# Patient Record
Sex: Female | Born: 1980 | Race: White | Hispanic: No | Marital: Married | Smoking: Former smoker
Health system: Southern US, Community
[De-identification: ages and names within clinical notes are randomized; demographics above are authoritative.]

## PROBLEM LIST (undated history)

## (undated) ENCOUNTER — Inpatient Hospital Stay (HOSPITAL_COMMUNITY): Payer: Self-pay

## (undated) DIAGNOSIS — Z789 Other specified health status: Secondary | ICD-10-CM

## (undated) HISTORY — PX: FOOT SURGERY: SHX648

## (undated) HISTORY — PX: SKIN BIOPSY: SHX1

---

## 2009-08-24 ENCOUNTER — Encounter (INDEPENDENT_AMBULATORY_CARE_PROVIDER_SITE_OTHER): Payer: Self-pay | Admitting: Obstetrics and Gynecology

## 2009-08-24 ENCOUNTER — Inpatient Hospital Stay (HOSPITAL_COMMUNITY): Admission: AD | Admit: 2009-08-24 | Discharge: 2009-08-27 | Payer: Self-pay | Admitting: Obstetrics and Gynecology

## 2009-10-12 ENCOUNTER — Ambulatory Visit: Admission: RE | Admit: 2009-10-12 | Discharge: 2009-10-12 | Payer: Self-pay | Admitting: Obstetrics and Gynecology

## 2010-01-23 ENCOUNTER — Other Ambulatory Visit: Admission: RE | Admit: 2010-01-23 | Discharge: 2010-01-23 | Payer: Self-pay | Admitting: Obstetrics and Gynecology

## 2010-08-26 LAB — RPR: RPR Ser Ql: NONREACTIVE

## 2010-08-26 LAB — CBC
HCT: 40.6 % (ref 36.0–46.0)
Hemoglobin: 10.2 g/dL — ABNORMAL LOW (ref 12.0–15.0)
MCHC: 35 g/dL (ref 30.0–36.0)
MCV: 98.2 fL (ref 78.0–100.0)
Platelets: 210 10*3/uL (ref 150–400)
RBC: 2.96 MIL/uL — ABNORMAL LOW (ref 3.87–5.11)
RDW: 12.9 % (ref 11.5–15.5)
RDW: 12.9 % (ref 11.5–15.5)
WBC: 9.4 10*3/uL (ref 4.0–10.5)

## 2010-08-26 LAB — TYPE AND SCREEN: Antibody Screen: NEGATIVE

## 2011-02-12 DIAGNOSIS — Z01419 Encounter for gynecological examination (general) (routine) without abnormal findings: Secondary | ICD-10-CM | POA: Insufficient documentation

## 2011-02-14 ENCOUNTER — Other Ambulatory Visit (HOSPITAL_COMMUNITY)
Admission: RE | Admit: 2011-02-14 | Discharge: 2011-02-14 | Disposition: A | Discharge status: Home / Self Care | Payer: BC Managed Care – PPO | Source: Ambulatory Visit | Attending: Obstetrics and Gynecology | Admitting: Obstetrics and Gynecology

## 2012-03-10 LAB — OB RESULTS CONSOLE ABO/RH: RH Type: POSITIVE

## 2012-03-10 LAB — OB RESULTS CONSOLE HEPATITIS B SURFACE ANTIGEN: Hepatitis B Surface Ag: NEGATIVE

## 2012-06-01 ENCOUNTER — Encounter (HOSPITAL_COMMUNITY): Payer: Self-pay

## 2012-06-01 ENCOUNTER — Ambulatory Visit (HOSPITAL_COMMUNITY)
Admission: RE | Admit: 2012-06-01 | Discharge: 2012-06-01 | Disposition: A | Discharge status: Home / Self Care | Payer: 59 | Source: Ambulatory Visit | Attending: Obstetrics and Gynecology | Admitting: Obstetrics and Gynecology

## 2012-06-01 ENCOUNTER — Other Ambulatory Visit: Payer: Self-pay | Admitting: Obstetrics and Gynecology

## 2012-06-01 DIAGNOSIS — O3430 Maternal care for cervical incompetence, unspecified trimester: Secondary | ICD-10-CM

## 2012-06-01 DIAGNOSIS — O34219 Maternal care for unspecified type scar from previous cesarean delivery: Secondary | ICD-10-CM | POA: Insufficient documentation

## 2012-06-02 NOTE — L&D Delivery Note (Signed)
Delivery Note At 9:53 AM a viable female was delivered via Vaginal, Spontaneous Delivery (Presentation: ; Occiput Anterior).  Nuchal cord x 2 reduced  APGAR: 9, 9; weight 8 lb 6 oz (3799 g).   Placenta status: Intact, Spontaneous.  Cord: 3 vessels with the following complications: None.  Cord pH: NA  Anesthesia: None  Episiotomy: None Lacerations: 2nd degree Suture Repair: 3.0 vicryl Est. Blood Loss (mL): 300  Mom to postpartum.  Baby to nursery-stable.  Devin Foskey J. 09/30/2012, 12:56 PM

## 2012-07-21 ENCOUNTER — Encounter (HOSPITAL_COMMUNITY): Payer: Self-pay

## 2012-07-21 ENCOUNTER — Observation Stay (HOSPITAL_COMMUNITY)
Admission: AD | Admit: 2012-07-21 | Discharge: 2012-07-21 | Disposition: A | Discharge status: Home / Self Care | Payer: BC Managed Care – PPO | Source: Ambulatory Visit | Attending: Obstetrics and Gynecology | Admitting: Obstetrics and Gynecology

## 2012-07-21 ENCOUNTER — Observation Stay (HOSPITAL_COMMUNITY): Payer: BC Managed Care – PPO

## 2012-07-21 DIAGNOSIS — O4703 False labor before 37 completed weeks of gestation, third trimester: Secondary | ICD-10-CM

## 2012-07-21 DIAGNOSIS — O4700 False labor before 37 completed weeks of gestation, unspecified trimester: Principal | ICD-10-CM | POA: Insufficient documentation

## 2012-07-21 HISTORY — DX: Other specified health status: Z78.9

## 2012-07-21 LAB — WET PREP, GENITAL: Yeast Wet Prep HPF POC: NONE SEEN

## 2012-07-21 LAB — FETAL FIBRONECTIN: Fetal Fibronectin: NEGATIVE

## 2012-07-21 MED ORDER — ACETAMINOPHEN 325 MG PO TABS: 650.0000 mg | ORAL_TABLET | ORAL | Status: DC | PRN

## 2012-07-21 MED ORDER — NIFEDIPINE 10 MG PO CAPS: 10.0000 mg | ORAL_CAPSULE | Freq: Four times a day (QID) | ORAL | Status: DC

## 2012-07-21 MED ORDER — LACTATED RINGERS IV SOLN: INTRAVENOUS | Status: DC

## 2012-07-21 MED ORDER — CALCIUM CARBONATE ANTACID 500 MG PO CHEW: 2.0000 | CHEWABLE_TABLET | ORAL | Status: DC | PRN

## 2012-07-21 MED ORDER — NIFEDIPINE 10 MG PO CAPS
20.0000 mg | ORAL_CAPSULE | Freq: Once | ORAL | Status: AC
  Administered 2012-07-21: 20 mg via ORAL
  Filled 2012-07-21: qty 2

## 2012-07-21 MED ORDER — PRENATAL MULTIVITAMIN CH: 1.0000 | ORAL_TABLET | Freq: Every day | ORAL | Status: DC

## 2012-07-21 MED ORDER — LACTATED RINGERS IV SOLN
INTRAVENOUS | Status: DC
  Administered 2012-07-21 (×2): via INTRAVENOUS

## 2012-07-21 MED ORDER — ZOLPIDEM TARTRATE 5 MG PO TABS: 5.0000 mg | ORAL_TABLET | Freq: Every evening | ORAL | Status: DC | PRN

## 2012-07-21 MED ORDER — LACTATED RINGERS IV SOLN
Freq: Once | INTRAVENOUS | Status: AC
  Administered 2012-07-21: 13:00:00 via INTRAVENOUS

## 2012-07-21 MED ORDER — NIFEDIPINE 10 MG PO CAPS
10.0000 mg | ORAL_CAPSULE | Freq: Four times a day (QID) | ORAL | Status: DC
  Administered 2012-07-21: 10 mg via ORAL
  Filled 2012-07-21: qty 1

## 2012-07-21 MED ORDER — DOCUSATE SODIUM 100 MG PO CAPS: 100.0000 mg | ORAL_CAPSULE | Freq: Every day | ORAL | Status: DC

## 2012-07-21 NOTE — MAU Provider Note (Signed)
History     CSN: [id number]  Arrival date and time: 07/21/12 1141   First Provider Initiated Contact with Patient 07/21/12 1255      Chief Complaint  Patient presents with  . Labor Eval   HPI Samantha Farley 32 y.o. [redacted]w[redacted]d  Sent from the office today as she was having contractions in the office and had been monitored there.  Is here today for further monitoring.     OB History   Grav Para Term Preterm Abortions TAB SAB Ect Mult Living   2 1 1  0 0 0 0 0 0 1      Past Medical History  Diagnosis Date  . Medical history non-contributory     Past Surgical History  Procedure Laterality Date  . Cesarean section    . Foot surgery Bilateral     Some type of surgery with the bones when pt was 32yo-32yo    Family History  Problem Relation Age of Onset  . Hypertension Maternal Grandmother   . Heart disease Maternal Grandfather   . Hypertension Maternal Grandfather     History  Substance Use Topics  . Smoking status: Former Games developer  . Smokeless tobacco: Not on file  . Alcohol Use: No    Allergies:  Allergies  Allergen Reactions  . Amoxil (Amoxicillin) Rash    Prescriptions prior to admission  Medication Sig Dispense Refill  . famotidine (PEPCID) 20 MG tablet Take 20 mg by mouth 2 (two) times daily as needed for heartburn.      . Prenatal Vit-Fe Fumarate-FA (PRENATAL MULTIVITAMIN) TABS Take 1 tablet by mouth daily.        Review of Systems  Constitutional: Negative for fever.  Gastrointestinal: Negative for nausea and vomiting.       Noticing contractions  Genitourinary:       No vaginal discharge. No vaginal bleeding. No dysuria.  Neurological: Negative for headaches.   Physical Exam   Blood pressure 106/73, pulse 103, temperature 98.7 F (37.1 C), temperature source Oral, resp. rate 16, height 5\' 6"  (1.676 m), weight 80.377 kg (177 lb 3.2 oz), SpO2 99.00%.  Physical Exam  Nursing note and vitals reviewed. Constitutional: She is oriented to person,  place, and time. She appears well-developed and well-nourished.  HENT:  Head: Normocephalic.  Eyes: EOM are normal.  Neck: Neck supple.  GI: Soft. There is tenderness.  Uterine irritability noted on monitor - client reports not as strong as when she was in the office.  FHT reassuring.  Genitourinary:  Speculum exam done and FFN and wet prep done.  Cervix - closed, soft, thick  Musculoskeletal: Normal range of motion.  Neurological: She is alert and oriented to person, place, and time.  Skin: Skin is warm and dry.  Psychiatric: She has a normal mood and affect.    MAU Course  Procedures Results for orders placed during the hospital encounter of 07/21/12 (from the past 24 hour(s))  FETAL FIBRONECTIN     Status: None   Collection Time    07/21/12  1:00 PM      Result Value Range   Fetal Fibronectin NEGATIVE  NEGATIVE  WET PREP, GENITAL     Status: Abnormal   Collection Time    07/21/12  1:00 PM      Result Value Range   Yeast Wet Prep HPF POC NONE SEEN  NONE SEEN   Trich, Wet Prep NONE SEEN  NONE SEEN   Clue Cells Wet Prep HPF POC FEW (*)  NONE SEEN   WBC, Wet Prep HPF POC FEW (*) NONE SEEN   MDM 1310  Consult with Dr. Dion Body Client was given procardia 29 mg and then had episode of weakness and tachycardia to 130.  IV infusing and head of bed reclined.  Client feeling better after 30-45 minutes, but contractions began to become more obvious to client.  Uterine tracing was more irritability than regular labor contractions. Consult with Dr. Dion Body - to place in observation.  Assessment and Plan  Threatened preterm labor  Plan Moved to the floor for observation  Velmer Broadfoot 07/21/2012, 1:41 PM

## 2012-07-21 NOTE — Progress Notes (Signed)
Pt feeling light headed. HOB down. Lilyan Punt Np notified of v/s and pt complaint

## 2012-07-21 NOTE — Progress Notes (Signed)
Pt more aware of ctxs

## 2012-07-21 NOTE — Progress Notes (Signed)
To u/s via w/c and then to 157. Pt aware and agrees with plan of care

## 2012-07-21 NOTE — MAU Note (Signed)
Patient states she has been having contractions every 5-10 minutes since this am. Denies bleeding or leaking and reports good fetal movement.

## 2012-07-21 NOTE — Progress Notes (Signed)
Lilyan Punt NP at bedside. Pt feeling better. Cautioned to move slowly when changes position.

## 2012-07-21 NOTE — Progress Notes (Signed)
Report called to Marrion Coy RN in Antenatal. Pt will go to u/s first and then to 157

## 2012-07-21 NOTE — Progress Notes (Signed)
Lilyan Punt NP at bedside.

## 2012-07-21 NOTE — MAU Note (Signed)
PT to u/s first and then to 157 in antenatal

## 2012-07-23 NOTE — MAU Provider Note (Signed)
Reviewed

## 2012-09-23 ENCOUNTER — Encounter (HOSPITAL_COMMUNITY): Payer: Self-pay | Admitting: Pharmacist

## 2012-09-26 ENCOUNTER — Encounter (HOSPITAL_COMMUNITY): Payer: Self-pay | Admitting: *Deleted

## 2012-09-26 ENCOUNTER — Inpatient Hospital Stay (HOSPITAL_COMMUNITY)
Admission: AD | Admit: 2012-09-26 | Discharge: 2012-09-26 | Disposition: A | Discharge status: Home / Self Care | Payer: BC Managed Care – PPO | Source: Ambulatory Visit | Attending: Obstetrics and Gynecology | Admitting: Obstetrics and Gynecology

## 2012-09-26 DIAGNOSIS — O479 False labor, unspecified: Secondary | ICD-10-CM | POA: Insufficient documentation

## 2012-09-26 NOTE — MAU Note (Signed)
Pt presents with complaints of irregular contractions over the last couple of days that are now 5 mins apart since early this morning. Denies any bleeding or LOF.

## 2012-09-30 ENCOUNTER — Inpatient Hospital Stay (HOSPITAL_COMMUNITY)
Admission: AD | Admit: 2012-09-30 | Discharge: 2012-10-01 | DRG: 373 | Disposition: A | Discharge status: Home / Self Care | Payer: BC Managed Care – PPO | Source: Ambulatory Visit | Attending: Obstetrics and Gynecology | Admitting: Obstetrics and Gynecology

## 2012-09-30 ENCOUNTER — Encounter (HOSPITAL_COMMUNITY): Payer: Self-pay | Admitting: *Deleted

## 2012-09-30 DIAGNOSIS — O34219 Maternal care for unspecified type scar from previous cesarean delivery: Secondary | ICD-10-CM | POA: Diagnosis present

## 2012-09-30 LAB — CBC
Hemoglobin: 12.5 g/dL (ref 12.0–15.0)
MCH: 30.4 pg (ref 26.0–34.0)
MCHC: 34.2 g/dL (ref 30.0–36.0)
MCV: 88.8 fL (ref 78.0–100.0)
Platelets: 225 10*3/uL (ref 150–400)

## 2012-09-30 LAB — RPR: RPR Ser Ql: NONREACTIVE

## 2012-09-30 MED ORDER — LACTATED RINGERS IV SOLN: 500.0000 mL | INTRAVENOUS | Status: DC | PRN

## 2012-09-30 MED ORDER — DIPHENHYDRAMINE HCL 25 MG PO CAPS: 25.0000 mg | ORAL_CAPSULE | Freq: Four times a day (QID) | ORAL | Status: DC | PRN

## 2012-09-30 MED ORDER — LANOLIN HYDROUS EX OINT: TOPICAL_OINTMENT | CUTANEOUS | Status: DC | PRN

## 2012-09-30 MED ORDER — OXYCODONE-ACETAMINOPHEN 5-325 MG PO TABS
1.0000 | ORAL_TABLET | ORAL | Status: DC | PRN
Start: 2012-09-30 — End: 2012-10-01
  Administered 2012-09-30: 1 via ORAL
  Filled 2012-09-30: qty 1

## 2012-09-30 MED ORDER — FERROUS SULFATE 325 (65 FE) MG PO TABS
325.0000 mg | ORAL_TABLET | Freq: Two times a day (BID) | ORAL | Status: DC
  Administered 2012-09-30 – 2012-10-01 (×3): 325 mg via ORAL
  Filled 2012-09-30 (×3): qty 1

## 2012-09-30 MED ORDER — METHYLERGONOVINE MALEATE 0.2 MG/ML IJ SOLN: 0.2000 mg | INTRAMUSCULAR | Status: DC | PRN

## 2012-09-30 MED ORDER — ZOLPIDEM TARTRATE 5 MG PO TABS: 5.0000 mg | ORAL_TABLET | Freq: Every evening | ORAL | Status: DC | PRN

## 2012-09-30 MED ORDER — OXYCODONE-ACETAMINOPHEN 5-325 MG PO TABS: 1.0000 | ORAL_TABLET | ORAL | Status: DC | PRN

## 2012-09-30 MED ORDER — EPHEDRINE 5 MG/ML INJ
10.0000 mg | INTRAVENOUS | Status: DC | PRN
  Filled 2012-09-30: qty 2

## 2012-09-30 MED ORDER — ONDANSETRON HCL 4 MG/2ML IJ SOLN: 4.0000 mg | INTRAMUSCULAR | Status: DC | PRN

## 2012-09-30 MED ORDER — OXYTOCIN 40 UNITS IN LACTATED RINGERS INFUSION - SIMPLE MED
62.5000 mL/h | INTRAVENOUS | Status: DC
  Administered 2012-09-30: 500 mL/h via INTRAVENOUS
  Filled 2012-09-30: qty 1000

## 2012-09-30 MED ORDER — LACTATED RINGERS IV SOLN: 500.0000 mL | Freq: Once | INTRAVENOUS | Status: DC

## 2012-09-30 MED ORDER — TETANUS-DIPHTH-ACELL PERTUSSIS 5-2.5-18.5 LF-MCG/0.5 IM SUSP
0.5000 mL | Freq: Once | INTRAMUSCULAR | Status: AC
  Administered 2012-10-01: 0.5 mL via INTRAMUSCULAR
  Filled 2012-09-30: qty 0.5

## 2012-09-30 MED ORDER — CITRIC ACID-SODIUM CITRATE 334-500 MG/5ML PO SOLN: 30.0000 mL | ORAL | Status: DC | PRN

## 2012-09-30 MED ORDER — BENZOCAINE-MENTHOL 20-0.5 % EX AERO
1.0000 "application " | INHALATION_SPRAY | CUTANEOUS | Status: DC | PRN
  Administered 2012-09-30: 1 via TOPICAL
  Filled 2012-09-30: qty 56

## 2012-09-30 MED ORDER — WITCH HAZEL-GLYCERIN EX PADS: 1.0000 "application " | MEDICATED_PAD | CUTANEOUS | Status: DC | PRN

## 2012-09-30 MED ORDER — EPHEDRINE 5 MG/ML INJ
10.0000 mg | INTRAVENOUS | Status: DC | PRN
  Filled 2012-09-30: qty 2
  Filled 2012-09-30: qty 4

## 2012-09-30 MED ORDER — LACTATED RINGERS IV SOLN
INTRAVENOUS | Status: DC
  Administered 2012-09-30: 08:00:00 via INTRAVENOUS

## 2012-09-30 MED ORDER — SIMETHICONE 80 MG PO CHEW: 80.0000 mg | CHEWABLE_TABLET | ORAL | Status: DC | PRN

## 2012-09-30 MED ORDER — FENTANYL 2.5 MCG/ML BUPIVACAINE 1/10 % EPIDURAL INFUSION (WH - ANES)
14.0000 mL/h | INTRAMUSCULAR | Status: DC | PRN
  Filled 2012-09-30: qty 125

## 2012-09-30 MED ORDER — FLEET ENEMA 7-19 GM/118ML RE ENEM: 1.0000 | ENEMA | RECTAL | Status: DC | PRN

## 2012-09-30 MED ORDER — IBUPROFEN 600 MG PO TABS: 600.0000 mg | ORAL_TABLET | Freq: Four times a day (QID) | ORAL | Status: DC | PRN

## 2012-09-30 MED ORDER — ONDANSETRON HCL 4 MG PO TABS: 4.0000 mg | ORAL_TABLET | ORAL | Status: DC | PRN

## 2012-09-30 MED ORDER — SENNOSIDES-DOCUSATE SODIUM 8.6-50 MG PO TABS
2.0000 | ORAL_TABLET | Freq: Every day | ORAL | Status: DC
  Administered 2012-09-30: 2 via ORAL

## 2012-09-30 MED ORDER — IBUPROFEN 600 MG PO TABS
600.0000 mg | ORAL_TABLET | Freq: Four times a day (QID) | ORAL | Status: DC
  Administered 2012-09-30 – 2012-10-01 (×6): 600 mg via ORAL
  Filled 2012-09-30 (×6): qty 1

## 2012-09-30 MED ORDER — METHYLERGONOVINE MALEATE 0.2 MG PO TABS: 0.2000 mg | ORAL_TABLET | ORAL | Status: DC | PRN

## 2012-09-30 MED ORDER — ACETAMINOPHEN 325 MG PO TABS: 650.0000 mg | ORAL_TABLET | ORAL | Status: DC | PRN

## 2012-09-30 MED ORDER — BUTORPHANOL TARTRATE 1 MG/ML IJ SOLN
1.0000 mg | INTRAMUSCULAR | Status: DC | PRN
  Administered 2012-09-30: 1 mg via INTRAVENOUS
  Filled 2012-09-30: qty 1

## 2012-09-30 MED ORDER — DIBUCAINE 1 % RE OINT: 1.0000 "application " | TOPICAL_OINTMENT | RECTAL | Status: DC | PRN

## 2012-09-30 MED ORDER — DIPHENHYDRAMINE HCL 50 MG/ML IJ SOLN: 12.5000 mg | INTRAMUSCULAR | Status: DC | PRN

## 2012-09-30 MED ORDER — PHENYLEPHRINE 40 MCG/ML (10ML) SYRINGE FOR IV PUSH (FOR BLOOD PRESSURE SUPPORT)
80.0000 ug | PREFILLED_SYRINGE | INTRAVENOUS | Status: DC | PRN
  Filled 2012-09-30: qty 2

## 2012-09-30 MED ORDER — PRENATAL MULTIVITAMIN CH
1.0000 | ORAL_TABLET | Freq: Every day | ORAL | Status: DC
  Administered 2012-09-30 – 2012-10-01 (×2): 1 via ORAL
  Filled 2012-09-30 (×2): qty 1

## 2012-09-30 MED ORDER — PHENYLEPHRINE 40 MCG/ML (10ML) SYRINGE FOR IV PUSH (FOR BLOOD PRESSURE SUPPORT)
80.0000 ug | PREFILLED_SYRINGE | INTRAVENOUS | Status: DC | PRN
  Filled 2012-09-30: qty 5
  Filled 2012-09-30: qty 2

## 2012-09-30 MED ORDER — OXYTOCIN BOLUS FROM INFUSION: 500.0000 mL | INTRAVENOUS | Status: DC

## 2012-09-30 MED ORDER — LIDOCAINE HCL (PF) 1 % IJ SOLN
30.0000 mL | INTRAMUSCULAR | Status: DC | PRN
  Filled 2012-09-30 (×4): qty 30

## 2012-09-30 MED ORDER — ONDANSETRON HCL 4 MG/2ML IJ SOLN: 4.0000 mg | Freq: Four times a day (QID) | INTRAMUSCULAR | Status: DC | PRN

## 2012-09-30 NOTE — MAU Note (Addendum)
Patient states had gush of water at 0630 this am; [place];  Contractions very frequent.

## 2012-09-30 NOTE — H&P (Signed)
Samantha Farley is a 32 y.o. female presenting at 39 wks and 6 days complaining of regular contractions and LOF. She ws 5 cm in MAU progressed to 8 cm. Pregnancy is complicated by h/o cesarean section. She desires to [facility]     Maternal Medical History:  Reason for admission: Contractions.   Contractions: Onset was 3-5 hours ago.   Frequency: regular.   Duration is approximately 60 seconds.   Perceived severity is strong.    Fetal activity: Perceived fetal activity is normal.   Last perceived fetal movement was within the past hour.    Prenatal complications: no prenatal complications Prenatal Complications - Diabetes: none.    OB History   Grav Para Term Preterm Abortions TAB SAB Ect Mult Living   2 2 2  0 0 0 0 0 0 2     Past Medical History  Diagnosis Date  . Medical history non-contributory    Past Surgical History  Procedure Laterality Date  . Cesarean section    . Foot surgery Bilateral     Some type of surgery with the bones when pt was 32yo-32yo   Family History: family history includes Heart disease in her maternal grandfather and Hypertension in her maternal grandfather and maternal grandmother. Social History:  reports that she has quit smoking. She does not have any smokeless tobacco history on file. She reports that she does not drink alcohol or use illicit drugs.   Prenatal Transfer Tool  Maternal Diabetes: No Genetic Screening: Declined Maternal Ultrasounds/Referrals: Normal Fetal Ultrasounds or other Referrals:  None Maternal Substance Abuse:  No Significant Maternal Medications:  None Significant Maternal Lab Results:  Lab values include: Group B Strep negative Other Comments:  None  ROS  Dilation: 10 Effacement (%): 100 Station: +2 Exam by:: dherr rn Blood pressure 116/76, pulse 81, temperature 99 F (37.2 C), temperature source Oral, resp. rate 16, height 5\' 6"  (1.676 m), weight 88.451 kg (195 lb), SpO2 96.00%, unknown if currently  breastfeeding. Maternal Exam:  Uterine Assessment: Contraction strength is moderate.  Contraction duration is 60 seconds. Contraction frequency is regular.   Abdomen: Patient reports no abdominal tenderness. Fetal presentation: vertex  Introitus: Normal vulva. Normal vagina.  Amniotic fluid character: clear.  Pelvis: adequate for delivery.   Cervix: Cervix evaluated by digital exam.     Fetal Exam Fetal Monitor Review: Mode: fetoscope.   Baseline rate: 130.  Variability: moderate (6-25 bpm).   Pattern: accelerations present and no decelerations.    Fetal State Assessment: Category I - tracings are normal.     Physical Exam  Constitutional: She is oriented to person, place, and time. She appears well-developed and well-nourished.  Cardiovascular: Normal rate and regular rhythm.   Respiratory: Effort normal.  Genitourinary: Vagina normal.  Musculoskeletal: Normal range of motion.  Neurological: She is alert and oriented to person, place, and time.  Skin: Skin is warm and dry.    Prenatal labs: ABO, Rh: --/--/A POS (05/01 0915) Antibody: NEG (05/01 0915) Rubella: Immune (10/09 0000) RPR: Nonreactive (10/09 0000)  HBsAg: Negative (10/09 0000)  HIV: Non-reactive (10/09 0000)  GBS: Negative (10/09 0000)   Assessment/Plan: 39 wks and 6 days in active labor  Desires TOL pt has been counselled regarding r/o TOL including but not limited to uterine rupture with r/o fetal and/ or maternal mortality if rupture occurs.  She voiced understanding and desires trial of labor.   Kendrell Lottman J. 09/30/2012, 12:50 PM

## 2012-10-01 LAB — CBC
HCT: 29.2 % — ABNORMAL LOW (ref 36.0–46.0)
Hemoglobin: 9.8 g/dL — ABNORMAL LOW (ref 12.0–15.0)
RBC: 3.24 MIL/uL — ABNORMAL LOW (ref 3.87–5.11)

## 2012-10-01 MED ORDER — IBUPROFEN 600 MG PO TABS: 600.0000 mg | ORAL_TABLET | Freq: Four times a day (QID) | ORAL | Status: DC

## 2012-10-01 NOTE — Discharge Summary (Signed)
Obstetric Discharge Summary Reason for Admission: onset of labor and rupture of membranes Prenatal Procedures: ultrasound Intrapartum Procedures: VBAC Postpartum Procedures: none Complications-Operative and Postpartum: 2nd degree perineal laceration Hemoglobin  Date Value Range Status  10/01/2012 9.8* 12.0 - 15.0 g/dL Final     DELTA CHECK NOTED     REPEATED TO VERIFY     HCT  Date Value Range Status  10/01/2012 29.2* 36.0 - 46.0 % Final    Physical Exam:  General: alert, cooperative and no distress Lochia: appropriate Uterine Fundus: firm Incision: n/a DVT Evaluation: No evidence of DVT seen on physical exam. Calf/Ankle edema is present.  Discharge Diagnoses: Term Pregnancy-delivered  Discharge Information: Date: 10/01/2012 Activity: pelvic rest Diet: routine Medications: PNV and Ibuprofen Condition: stable Instructions: See discharge instructions. Discharge to: home Follow-up Information   Follow up with Geryl Rankins, MD. Schedule an appointment as soon as possible for a visit in 6 weeks. (Postpartum check)    Contact information:   [address], [address] [place] [address] [phone]       Newborn Data: Live born female  Birth Weight: 8 lb 7 oz (3827 g) APGAR: 9, 9  Home with mother. Circumcision done day of discharge.  Geryl Rankins 10/01/2012, 6:23 PM

## 2012-10-03 LAB — TYPE AND SCREEN
ABO/RH(D): A POS
Unit division: 0
Unit division: 0

## 2012-10-04 ENCOUNTER — Other Ambulatory Visit (HOSPITAL_COMMUNITY): Payer: Self-pay

## 2012-10-05 ENCOUNTER — Other Ambulatory Visit (HOSPITAL_COMMUNITY): Payer: Self-pay

## 2012-10-06 ENCOUNTER — Inpatient Hospital Stay (HOSPITAL_COMMUNITY)
Admission: RE | Admit: 2012-10-06 | Payer: BC Managed Care – PPO | Source: Ambulatory Visit | Admitting: Obstetrics and Gynecology

## 2012-10-06 ENCOUNTER — Encounter (HOSPITAL_COMMUNITY): Admission: RE | Payer: Self-pay | Source: Ambulatory Visit

## 2012-10-06 SURGERY — Surgical Case
Anesthesia: Regional

## 2012-11-11 ENCOUNTER — Other Ambulatory Visit: Payer: Self-pay | Admitting: Obstetrics and Gynecology

## 2012-11-11 ENCOUNTER — Other Ambulatory Visit (HOSPITAL_COMMUNITY)
Admission: RE | Admit: 2012-11-11 | Discharge: 2012-11-11 | Disposition: A | Discharge status: Home / Self Care | Payer: BC Managed Care – PPO | Source: Ambulatory Visit | Attending: Obstetrics and Gynecology | Admitting: Obstetrics and Gynecology

## 2012-11-11 DIAGNOSIS — Z01419 Encounter for gynecological examination (general) (routine) without abnormal findings: Secondary | ICD-10-CM | POA: Insufficient documentation

## 2012-11-11 DIAGNOSIS — Z1151 Encounter for screening for human papillomavirus (HPV): Secondary | ICD-10-CM | POA: Insufficient documentation

## 2013-02-23 ENCOUNTER — Ambulatory Visit (INDEPENDENT_AMBULATORY_CARE_PROVIDER_SITE_OTHER): Payer: BC Managed Care – PPO | Admitting: Neurology

## 2013-02-23 ENCOUNTER — Encounter: Payer: Self-pay | Admitting: Neurology

## 2013-02-23 VITALS — BP 120/82 | HR 76 | Ht 66.5 in | Wt 164.0 lb

## 2013-02-23 DIAGNOSIS — F09 Unspecified mental disorder due to known physiological condition: Secondary | ICD-10-CM

## 2013-02-23 DIAGNOSIS — R42 Dizziness and giddiness: Secondary | ICD-10-CM

## 2013-02-23 DIAGNOSIS — R4189 Other symptoms and signs involving cognitive functions and awareness: Secondary | ICD-10-CM

## 2013-02-23 NOTE — Progress Notes (Signed)
[facility]  Provider:  Dr Hosie Poisson Referring Provider: Hollice Espy, MD Primary Care Physician:  No primary provider on file.  CC:  Confusion, disorientation, vision changes  HPI:  Samantha Farley is a 32 y.o. female here as a referral from Dr. Kevan Ny for disorientation and vision changes  Around one month ago, she feels as been getting gradually worse since then. 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 She notes simple things like deciding whether or not to go on a green light when stopped it has become difficult for her. Remote memory remains intact. This is causing difficulty with work and daily functioning. She also feels her eyes have more trouble focusing, cannot block in entirety on an object. Feels unsteady on her feet, needs to take a step to catch self occasionally. Has not had any falls. Was feeling great prior to her on a month and a half ago. Had her second baby in May, felt great afterwards. Feels stress level is decreasing in her life, getting around 5 or 6 hours of sleep a night. No new medications in the past few months. No recent fevers, illnesses. No neck stiffness, no headaches. She does know getting bit by a dog a few weeks ago, states the dog had had a rabies vaccine.  Does get opthalmic migraines in the past, gets squiggly vision. Has one drink a night. Maternal grandfather has PD and dementia, maternal grandmother deceased from LBD. Paternal grandfather depressed and comitted suicide.   Review of Systems: Out of a complete 14 system review, the patient complains of only the following symptoms, and all other reviewed systems are negative. Positive for fatigue memory loss confusion dizziness increased thirst restless legs decreased energy allergies runny nose  History   Social History  . Marital Status: Married    Spouse Name: N/A    Number of Children: N/A  . Years of  Education: N/A   Occupational History  . Not on file.   Social History Main Topics  . Smoking status: Former Games developer  . Smokeless tobacco: Never Used  . Alcohol Use: No  . Drug Use: No  . Sexual Activity: Yes     Comment: pregnant   Other Topics Concern  . Not on file   Social History Narrative  . No narrative on file    Family History  Problem Relation Age of Onset  . Hypertension Maternal Grandmother   . Heart disease Maternal Grandfather   . Hypertension Maternal Grandfather     Past Medical History  Diagnosis Date  . Medical history non-contributory     Past Surgical History  Procedure Laterality Date  . Cesarean section    . Foot surgery Bilateral     Some type of surgery with the bones when pt was 32yo-32yo    Current Outpatient Prescriptions  Medication Sig Dispense Refill  . acetaminophen (TYLENOL) 500 MG tablet Take 1,000 mg by mouth every 6 (six) hours as needed for pain.      . famotidine (PEPCID) 20 MG tablet Take 20 mg by mouth 2 (two) times daily as needed for heartburn.      Marland Kitchen ibuprofen (ADVIL,MOTRIN) 600 MG tablet Take 1 tablet (600 mg total) by mouth every 6 (six) hours.  30 tablet  1  . Prenatal Vit-Fe Fumarate-FA (PRENATAL MULTIVITAMIN) TABS Take 1 tablet by mouth daily.       No current facility-administered medications for this visit.  Allergies as of 02/23/2013 - Review Complete 02/23/2013  Allergen Reaction Noted  . Amoxil [amoxicillin] Rash 07/21/2012    Vitals: BP 120/82  Pulse 76  Ht 5' 6.5" (1.689 m)  Wt 164 lb (74.39 kg)  BMI 26.08 kg/m2 Last Weight:  Wt Readings from Last 1 Encounters:  02/23/13 164 lb (74.39 kg)   Last Height:   Ht Readings from Last 1 Encounters:  02/23/13 5' 6.5" (1.689 m)     Physical exam: Exam: Gen: NAD, conversant Eyes: anicteric sclerae, moist conjunctivae HENT: Atraumatic Neck: Trachea midline; supple,  Lungs: CTA, no wheezing, rales, rhonic                          CV: RRR, no  MRG Abdomen: Soft, non-tender;  Extremities: No peripheral edema  Skin: Normal temperature, no rash,  Psych: Appropriate affect, pleasant  Neuro: Samantha: AA&Ox3, appropriately interactive, normal affect   MOCA 28/30 -1 fluency -1 Delayed recall  CN: L APD, EOMI no nystagmus, no ptosis, sensation intact to LT V1-V3 bilat, face symmetric, no weakness, hearing grossly intact, palate elevates symmetrically, shoulder shrug 5/5 bilat,  tongue protrudes midline, no fasiculations noted.  Motor: normal bulk and tone Strength: 5/5  In all extremities  Coord: rapid alternating and point-to-point (FNF, HTS) movements intact.  Reflexes: symmetrical, bilat downgoing toes  Sens: LT intact in all extremities  Gait: posture, stance, stride and arm-swing normal. Some difficulty but overall tandem gait intact. Able to walk on heels and toes. Romberg absent.   Assessment:  After physical and neurologic examination, review of laboratory studies, imaging, neurophysiology testing and pre-existing records, assessment will be reviewed on the problem list.  Plan:  Treatment plan and additional workup will be reviewed under Problem List.  Samantha Farley is a pleasant 32 year old woman who presents for initial evaluation of recent onset disorientation, depressed cognitive function, visual changes and gait changes. She currently works as a International aid/development worker and is a difficulty performing daily activities, trouble with processing information quickly and making decisions. This has begun to affect her job and daily life. Physical exam is pertinent for a mild left atrium pupil or defect and a MOCA of 28/30. Based on patient's education level this score should be considered abnormal. Unclear etiology of her symptoms. Based on history and exam findings, including left APD would have concern for multiple sclerosis. Will check lab work reversible causes of cognitive decline. Will check brain MRI with and without contrast. If  unremarkable would consider formal cognitive testing and or a lumbar puncture.  1)Cognitive decline 2)visual changes 3)dizziness  -MRI w/ and w/out contrast -lab work for reversible cause of cognitive decline -in future consider formal cognitive testing and/or lumbar puncture depending on above results -follow up once MRI complete

## 2013-02-23 NOTE — Patient Instructions (Signed)
Overall you are doing fairly well but I do want to suggest a few things today:   Remember to drink plenty of fluid, eat healthy meals and do not skip any meals. Try to eat protein with a every meal and eat a healthy snack such as fruit or nuts in between meals. Try to keep a regular sleep-wake schedule and try to exercise daily, particularly in the form of walking, 20-30 minutes a day, if you can.   As far as diagnostic testing:  1)MRI of the brain with and without contrast 2)Lab work to check for reversible causes of your memory decline  I would like to see you back once the MRI is complete, sooner if we need to. Please call us with any interim questions, concerns, problems, updates or refill requests.   Please also call us for any test results so we can go over those with you on the phone.  My clinical assistant and will answer any of your questions and relay your messages to me and also relay most of my messages to you.   Our phone number is [phone]. We also have an after hours call service for urgent matters and there is a physician on-call for urgent questions. For any emergencies you know to call 911 or go to the nearest emergency room

## 2013-02-28 ENCOUNTER — Encounter: Payer: Self-pay | Admitting: Neurology

## 2013-02-28 LAB — METHYLMALONIC ACID, SERUM: Methylmalonic Acid: 97 nmol/L (ref 0–378)

## 2013-02-28 LAB — TSH: TSH: 1.67 u[IU]/mL (ref 0.450–4.500)

## 2013-02-28 LAB — HOMOCYSTEINE: Homocysteine: 9.8 umol/L (ref 0.0–15.0)

## 2013-02-28 LAB — VITAMIN B1, WHOLE BLOOD: Thiamine: 164.6 nmol/L (ref 66.5–200.0)

## 2013-02-28 LAB — VITAMIN B12: Vitamin B-12: 325 pg/mL (ref 211–946)

## 2013-03-02 ENCOUNTER — Ambulatory Visit (INDEPENDENT_AMBULATORY_CARE_PROVIDER_SITE_OTHER): Payer: BC Managed Care – PPO

## 2013-03-02 DIAGNOSIS — R42 Dizziness and giddiness: Secondary | ICD-10-CM

## 2013-03-02 DIAGNOSIS — F09 Unspecified mental disorder due to known physiological condition: Secondary | ICD-10-CM

## 2013-03-02 DIAGNOSIS — R4189 Other symptoms and signs involving cognitive functions and awareness: Secondary | ICD-10-CM

## 2013-03-03 ENCOUNTER — Telehealth: Payer: Self-pay | Admitting: Neurology

## 2013-03-03 ENCOUNTER — Other Ambulatory Visit: Payer: Self-pay | Admitting: Neurology

## 2013-03-03 DIAGNOSIS — R42 Dizziness and giddiness: Secondary | ICD-10-CM

## 2013-03-03 DIAGNOSIS — R4189 Other symptoms and signs involving cognitive functions and awareness: Secondary | ICD-10-CM

## 2013-03-03 NOTE — Telephone Encounter (Signed)
Called patient to discuss results of MRI and lab work which were both normal. Will proceed with formal cognitive testing.

## 2013-04-20 ENCOUNTER — Telehealth: Payer: Self-pay | Admitting: Neurology

## 2013-04-20 NOTE — Telephone Encounter (Signed)
Please advise 

## 2013-04-21 NOTE — Telephone Encounter (Signed)
Called patient to inform her that Dr. Hosie Poisson stated that it was ok to cancel her appointment with the neuropsychiatrist. Patient verbalized understanding. I advised the patient that if she has any other problems, questions or concerns to call the office.

## 2013-04-21 NOTE — Telephone Encounter (Signed)
No she can cancel it. Thanks.

## 2013-05-11 DIAGNOSIS — R413 Other amnesia: Secondary | ICD-10-CM

## 2013-06-10 ENCOUNTER — Telehealth: Payer: Self-pay | Admitting: Neurology

## 2013-06-10 NOTE — Telephone Encounter (Signed)
REGARDING RECOMMENDATIONS SINCE SEEING DR. Leonides CaveZELSON

## 2013-06-14 ENCOUNTER — Telehealth: Payer: Self-pay | Admitting: Neurology

## 2013-06-14 DIAGNOSIS — R4189 Other symptoms and signs involving cognitive functions and awareness: Secondary | ICD-10-CM

## 2013-06-14 NOTE — Telephone Encounter (Signed)
Had results from [name] faxed and gave to [name] for review

## 2013-06-14 NOTE — Telephone Encounter (Signed)
Hi Stephanie,   Can you please contact Dr Darolyn RuaZelsons office and have them send a copy of the report over to us. Thanks.

## 2013-06-14 NOTE — Telephone Encounter (Signed)
Patient is requesting to speak with [name] concerning her visit with [name] and what are his recommendations about his diagnosis

## 2013-06-14 NOTE — Telephone Encounter (Signed)
Discussed results of formal cognitive testing with patient. Also discussed results with Dr. Leonides CaveZelson. [facility]

## 2013-06-24 ENCOUNTER — Telehealth: Payer: Self-pay | Admitting: Neurology

## 2013-06-24 NOTE — Telephone Encounter (Signed)
06-22-13 [facility] is [facility] and not taking any additional pts. Will try [facility] and [facility]. sp

## 2013-06-24 NOTE — Telephone Encounter (Signed)
Pt called and stated that Dr. Hosie PoissonSumner was supposed to refer her to another hospital.  He told her it would be at least a week to handle that referral.  She states it has been several weeks and she hasn't heard anything.  She would like to know the status of the referral.  Thank you.

## 2013-10-26 ENCOUNTER — Emergency Department (HOSPITAL_COMMUNITY): Payer: BC Managed Care – PPO

## 2013-10-26 ENCOUNTER — Encounter (HOSPITAL_COMMUNITY): Payer: Self-pay | Admitting: Emergency Medicine

## 2013-10-26 ENCOUNTER — Emergency Department (HOSPITAL_COMMUNITY)
Admission: EM | Admit: 2013-10-26 | Discharge: 2013-10-27 | Disposition: A | Discharge status: Home / Self Care | Payer: BC Managed Care – PPO | Attending: Emergency Medicine | Admitting: Emergency Medicine

## 2013-10-26 DIAGNOSIS — R51 Headache: Secondary | ICD-10-CM | POA: Insufficient documentation

## 2013-10-26 DIAGNOSIS — J029 Acute pharyngitis, unspecified: Secondary | ICD-10-CM | POA: Insufficient documentation

## 2013-10-26 DIAGNOSIS — Z87891 Personal history of nicotine dependence: Secondary | ICD-10-CM | POA: Insufficient documentation

## 2013-10-26 DIAGNOSIS — Z79899 Other long term (current) drug therapy: Secondary | ICD-10-CM | POA: Insufficient documentation

## 2013-10-26 DIAGNOSIS — M542 Cervicalgia: Secondary | ICD-10-CM | POA: Insufficient documentation

## 2013-10-26 DIAGNOSIS — M436 Torticollis: Secondary | ICD-10-CM | POA: Insufficient documentation

## 2013-10-26 DIAGNOSIS — Z880 Allergy status to penicillin: Secondary | ICD-10-CM | POA: Insufficient documentation

## 2013-10-26 DIAGNOSIS — R509 Fever, unspecified: Secondary | ICD-10-CM | POA: Insufficient documentation

## 2013-10-26 LAB — CBC WITH DIFFERENTIAL/PLATELET
Basophils Absolute: 0 10*3/uL (ref 0.0–0.1)
Basophils Relative: 0 % (ref 0–1)
EOS ABS: 0 10*3/uL (ref 0.0–0.7)
EOS PCT: 1 % (ref 0–5)
HCT: 40.1 % (ref 36.0–46.0)
HEMOGLOBIN: 13.6 g/dL (ref 12.0–15.0)
LYMPHS ABS: 1.6 10*3/uL (ref 0.7–4.0)
Lymphocytes Relative: 20 % (ref 12–46)
MCH: 30.1 pg (ref 26.0–34.0)
MCHC: 33.9 g/dL (ref 30.0–36.0)
MCV: 88.7 fL (ref 78.0–100.0)
MONOS PCT: 9 % (ref 3–12)
Monocytes Absolute: 0.7 10*3/uL (ref 0.1–1.0)
Neutro Abs: 5.6 10*3/uL (ref 1.7–7.7)
Neutrophils Relative %: 70 % (ref 43–77)
Platelets: 237 10*3/uL (ref 150–400)
RBC: 4.52 MIL/uL (ref 3.87–5.11)
RDW: 13.1 % (ref 11.5–15.5)
WBC: 8 10*3/uL (ref 4.0–10.5)

## 2013-10-26 LAB — BASIC METABOLIC PANEL
BUN: 11 mg/dL (ref 6–23)
CALCIUM: 8.8 mg/dL (ref 8.4–10.5)
CO2: 23 meq/L (ref 19–32)
Chloride: 99 mEq/L (ref 96–112)
Creatinine, Ser: 0.49 mg/dL — ABNORMAL LOW (ref 0.50–1.10)
GFR calc Af Amer: >90 mL/min (ref >90)
GLUCOSE: 80 mg/dL (ref 70–99)
POTASSIUM: 3.9 meq/L (ref 3.7–5.3)
Sodium: 139 mEq/L (ref 137–147)

## 2013-10-26 LAB — MONONUCLEOSIS SCREEN: Mono Screen: NEGATIVE

## 2013-10-26 LAB — RAPID STREP SCREEN (MED CTR MEBANE ONLY): Streptococcus, Group A Screen (Direct): NEGATIVE

## 2013-10-26 MED ORDER — KETOROLAC TROMETHAMINE 30 MG/ML IJ SOLN
15.0000 mg | Freq: Once | INTRAMUSCULAR | Status: AC
  Administered 2013-10-26: 15 mg via INTRAVENOUS
  Filled 2013-10-26: qty 1

## 2013-10-26 MED ORDER — DEXAMETHASONE SODIUM PHOSPHATE 10 MG/ML IJ SOLN
10.0000 mg | Freq: Once | INTRAMUSCULAR | Status: AC
  Administered 2013-10-26: 10 mg via INTRAVENOUS
  Filled 2013-10-26: qty 1

## 2013-10-26 MED ORDER — SODIUM CHLORIDE 0.9 % IV BOLUS (SEPSIS)
500.0000 mL | Freq: Once | INTRAVENOUS | Status: AC
  Administered 2013-10-26: 500 mL via INTRAVENOUS

## 2013-10-26 MED ORDER — ONDANSETRON HCL 4 MG/2ML IJ SOLN
4.0000 mg | Freq: Once | INTRAMUSCULAR | Status: AC
  Administered 2013-10-26: 4 mg via INTRAVENOUS
  Filled 2013-10-26: qty 2

## 2013-10-26 MED ORDER — SODIUM CHLORIDE 0.9 % IV SOLN
Freq: Once | INTRAVENOUS | Status: AC
  Administered 2013-10-26: 20:00:00 via INTRAVENOUS

## 2013-10-26 MED ORDER — MORPHINE SULFATE 4 MG/ML IJ SOLN
4.0000 mg | INTRAMUSCULAR | Status: DC | PRN
Start: 2013-10-26 — End: 2013-10-26

## 2013-10-26 MED ORDER — IOHEXOL 300 MG/ML  SOLN
100.0000 mL | Freq: Once | INTRAMUSCULAR | Status: AC | PRN
  Administered 2013-10-26: 80 mL via INTRAVENOUS

## 2013-10-26 MED ORDER — MORPHINE SULFATE 4 MG/ML IJ SOLN
4.0000 mg | Freq: Once | INTRAMUSCULAR | Status: AC
Start: 2013-10-26 — End: 2013-10-26
  Administered 2013-10-26: 4 mg via INTRAVENOUS
  Filled 2013-10-26: qty 1

## 2013-10-26 NOTE — ED Provider Notes (Signed)
CSN: [id number]     Arrival date & time 10/26/13  1718 History   First MD Initiated Contact with Patient 10/26/13 1850     Chief Complaint  Patient presents with  . Fever  . neck pain   . Headache     Patient is a 33 y.o. female presenting with fever and headaches. The history is provided by the patient.  Fever Associated symptoms: headaches   Associated symptoms: no chest pain, no chills, no confusion, no cough, no diarrhea, no dysuria, no nausea, no rash, no sore throat and no vomiting   Headache Associated symptoms: fever, neck pain and neck stiffness   Associated symptoms: no abdominal pain, no cough, no diarrhea, no dizziness, no fatigue, no nausea, no sore throat and no vomiting    Patient presents for evaluationof headache and neck pain.  On Sunday, 4 days ago she developed a stiff neck. Really no since awakening. Developed a posterior headache as later the same day. The examination developed a low-grade fever. Yesterday Tuesday morning it was 102. Headache and neck stiffness persistent. Seen in urgent care last night. No specific diagnosis made. Again had fever this morning. Some Motrin. Temperature has improved. Her headache has improved. She still feels some stiffness in her neck. This is mild, however persists. She's I have rash. She is photosensitivity. She has not had nausea or vomiting. She states she has a sore throat and she attributed to some sinus drainage. No ear pain although they feel full. No swelling in the neck. No difficulty with swallowing. No poor dentition. No joint pain   Past Medical History  Diagnosis Date  . Medical history non-contributory    Past Surgical History  Procedure Laterality Date  . Cesarean section    . Foot surgery Bilateral     Some type of surgery with the bones when pt was 33yo-33yo   Family History  Problem Relation Age of Onset  . Hypertension Maternal Grandmother   . Heart disease Maternal Grandfather   . Hypertension Maternal  Grandfather    History  Substance Use Topics  . Smoking status: Former Games developer  . Smokeless tobacco: Never Used  . Alcohol Use: No   OB History   Grav Para Term Preterm Abortions TAB SAB Ect Mult Living   2 2 2  0 0 0 0 0 0 2     Review of Systems  Constitutional: Positive for fever. Negative for chills, diaphoresis, appetite change and fatigue.  HENT: Negative for mouth sores, sore throat and trouble swallowing.   Eyes: Negative for visual disturbance.  Respiratory: Negative for cough, chest tightness, shortness of breath and wheezing.   Cardiovascular: Negative for chest pain.  Gastrointestinal: Negative for nausea, vomiting, abdominal pain, diarrhea and abdominal distention.  Endocrine: Negative for polydipsia, polyphagia and polyuria.  Genitourinary: Negative for dysuria, frequency and hematuria.  Musculoskeletal: Positive for neck pain and neck stiffness. Negative for gait problem.  Skin: Negative for color change, pallor and rash.  Neurological: Positive for headaches. Negative for dizziness, syncope and light-headedness.  Hematological: Does not bruise/bleed easily.  Psychiatric/Behavioral: Negative for behavioral problems and confusion.      Allergies  Amoxil  Home Medications   Prior to Admission medications   Medication Sig Start Date End Date Taking? Authorizing Provider  acetaminophen (TYLENOL) 500 MG tablet Take 1,000 mg by mouth every 6 (six) hours as needed for pain.   Yes Historical Provider, MD  cetirizine (ZYRTEC) 10 MG tablet Take 10 mg by mouth daily  as needed for allergies.   Yes Historical Provider, MD  ibuprofen (ADVIL,MOTRIN) 400 MG tablet Take 400 mg by mouth every 6 (six) hours as needed for headache or moderate pain.   Yes Historical Provider, MD  norgestimate-ethinyl estradiol (ORTHO-CYCLEN,SPRINTEC,PREVIFEM) 0.25-35 MG-MCG tablet Take 1 tablet by mouth daily.   Yes Historical Provider, MD   BP 119/78  Pulse 94  Temp(Src) 99.1 F (37.3 C) (Oral)   Resp 17  SpO2 100%  LMP 10/23/2013 Physical Exam  Constitutional: She is oriented to person, place, and time. She appears well-developed and well-nourished.  Non-toxic appearance. She does not have a sickly appearance. She does not appear ill. No distress.  She does not appear ill  HENT:  Head: Normocephalic.  Right Ear: Tympanic membrane is not injected.  Left Ear: Tympanic membrane is not injected.  Mouth/Throat:    Posterior pharyngeal tissues appear normal. Her tonsillar pillars are both slightly large 2+ cryptic. Exudate bilaterally. Normal tone. Normal uvula. For the mouth appears normal. She is minimally enlarged bilateral anterior cervical lymph nodes. No  posterior cervical lymph node  Eyes: Conjunctivae are normal. Pupils are equal, round, and reactive to light. No scleral icterus.  PERRL. Cranial nerves intact and symmetric.  Neck: Normal range of motion. Neck supple. No thyromegaly present.    Minimal limitation of neck movement due to pain. As she leans forward touch her chin to her chest she states she feels somewhat tight in the posterior aspect of her neck. No frank meningismus down the spine.  Cardiovascular: Normal rate and regular rhythm.  Exam reveals no gallop and no friction rub.   No murmur heard. Pulmonary/Chest: Effort normal and breath sounds normal. No respiratory distress. She has no wheezes. She has no rales.  Clear lungs  Abdominal: Soft. Bowel sounds are normal. She exhibits no distension. There is no tenderness. There is no rebound.  Soft benign abdomen  Musculoskeletal: Normal range of motion.  Neurological: She is alert and oriented to person, place, and time.  Skin: Skin is warm and dry. No rash noted.  No skin rash  Psychiatric: She has a normal mood and affect. Her behavior is normal.    ED Course  LUMBAR PUNCTURE Date/Time: 10/26/2013 11:35 PM Performed by: Rolland PorterJAMES, Kyri Dai Authorized by: Rolland PorterJAMES, Dima Mini Consent: Verbal consent obtained. written  consent obtained. Risks and benefits: risks, benefits and alternatives were discussed Consent given by: patient Patient understanding: patient states understanding of the procedure being performed Patient identity confirmed: verbally with patient Time out: Immediately prior to procedure a "time out" was called to verify the correct patient, procedure, equipment, support staff and site/side marked as required. Indications: evaluation for infection Anesthesia: local infiltration Local anesthetic: lidocaine 1% without epinephrine Anesthetic total: 3 ml Patient sedated: no Preparation: Patient was prepped and draped in the usual sterile fashion. Lumbar space: L3-L4 interspace Patient's position: left lateral decubitus Needle gauge: 22 Needle type: diamond point Needle length: 3.5 in Number of attempts: 1 Opening pressure: 8 cm H2O Fluid appearance: clear Tubes of fluid: 4 Total volume: 4 ml Post-procedure: adhesive bandage applied Patient tolerance: Patient tolerated the procedure well with no immediate complications.   (including critical care time) Labs Review Labs Reviewed  BASIC METABOLIC PANEL - Abnormal; Notable for the following:    Creatinine, Ser 0.49 (*)    All other components within normal limits  RAPID STREP SCREEN  GRAM STAIN  CULTURE, GROUP A STREP  CSF CULTURE  CBC WITH DIFFERENTIAL  MONONUCLEOSIS SCREEN  GLUCOSE, CSF  PROTEIN, CSF  CSF CELL COUNT WITH DIFFERENTIAL  CSF CELL COUNT WITH DIFFERENTIAL    Imaging Review Ct Soft Tissue Neck W Contrast  10/26/2013   CLINICAL DATA:  Neck pain and stiffness, fever, throat pain  EXAM: CT NECK WITH CONTRAST  TECHNIQUE: Multidetector CT imaging of the neck was performed using the standard protocol following the bolus administration of intravenous contrast. Sagittal and coronal MPR images reconstructed from axial data set.  CONTRAST:  80mL OMNIPAQUE IOHEXOL 300 MG/ML  SOLN IV  COMPARISON:  None  FINDINGS: Visualized  intracranial structures grossly normal appearance for nondedicated technique.  Beam hardening artifacts of dental origin.  Vascular structures patent.  Prominent parapharyngeal and tonsillar lymphoid tissue.  No evidence of parapharyngeal abscess or soft tissue edema.  Scattered normal size to enlarged cervical lymph nodes bilaterally.  Symmetric parotid, submandibular and thyroid glands.  Prevertebral soft tissues normal thickness.  Lung apices clear.  No pharyngeal, hypo pharyngeal, or airway narrowing.  No regional mass or abnormal fluid collection.  Osseous structures unremarkable.  IMPRESSION: Scattered normal size to mildly enlarged cervical lymph nodes bilaterally, nonspecific.  Otherwise negative CT neck.   Electronically Signed   By: Ulyses Southward M.D.   On: 10/26/2013 22:52     EKG Interpretation None      MDM   Final diagnoses:  Fever  Neck pain    Initial impression was for possible meningitis although her exam is not markedly suggested. She's been symptomatic for 3 days appears quite nontoxic. We discussed CT of her neck. She has some bilateral pain or discomfort just inferior to the ramus of the mandible. It does not appear asymmetric. Strep swab is negative. CT neck is negative with exception of a large lymphatic tissue and minimally enlarged even normal limits nodes. No abscess. No retropharyngeal abscess.  We discussed lumbar puncture. At this 0.72 hours if she doesn't meningitis will clearly be a septic. We discussed plus minus of the lumbar puncture. She consents to have this performed. Tolerated this without difficulty. Clear initially. She tolerated this well. Given some IV Decadron.  Spinal fluid shows one white blood cell, no red blood cells. Negative Gram stain.    Rolland Porter, MD 10/27/13 [phone]

## 2013-10-26 NOTE — ED Notes (Signed)
Patient transported to CT 

## 2013-10-26 NOTE — Progress Notes (Signed)
  CARE MANAGEMENT ED NOTE 10/26/2013  Patient:  Samantha Farley, Samantha Farley   Account Number:  [id number]  Date Initiated:  10/26/2013  Documentation initiated by:  Radford Pax  Subjective/Objective Assessment:   Patient presents to Ed with pain and stiffness, headache and fever.     Subjective/Objective Assessment Detail:     Action/Plan:   Action/Plan Detail:   Anticipated DC Date:       Status Recommendation to Physician:   Result of Recommendation:    Other ED Services  Consult Working Plan    DC Planning Services  Other  PCP issues    Choice offered to / List presented to:            Status of service:  Completed, signed off  ED Comments:   ED Comments Detail:  EDCM spoke to patient at bedside.  Patient confirms she does not have a pcp.  EDCM instructed patient to call the phone number on the back of her insurance card or go to insurance company website to assist patient to find a pcp who is close to her and within network.  Patient verbalized understanding.  No further EDCM needs at this time.

## 2013-10-26 NOTE — ED Notes (Signed)
Pt c/o neck stiffness/pain that she woke up with on Sunday. Later Sunday she started having a headache.  Pt felt bad at [facility] and went to urgent care yesterday. Had fever 102.5 fever last night and 102.7 this morning. Pt took ibuprofen and fever returned to normal. WBC was normal at Urgent care. Pt didn't feel she needed to go to ED last night and now wanting to make sure she doesn't have meningitis.

## 2013-10-27 LAB — GRAM STAIN
Gram Stain: NONE SEEN
Special Requests: NORMAL

## 2013-10-27 LAB — CSF CELL COUNT WITH DIFFERENTIAL
RBC Count, CSF: 0 /mm3
RBC Count, CSF: 0 /mm3
Tube #: 1
Tube #: 4
WBC CSF: 0 /mm3 (ref 0–5)
WBC, CSF: 1 /mm3 (ref 0–5)

## 2013-10-27 LAB — PROTEIN, CSF: Total  Protein, CSF: 16 mg/dL (ref 15–45)

## 2013-10-27 LAB — GLUCOSE, CSF: Glucose, CSF: 56 mg/dL (ref 43–76)

## 2013-10-27 MED ORDER — HYDROCODONE-ACETAMINOPHEN 5-325 MG PO TABS: 2.0000 | ORAL_TABLET | ORAL | Status: DC | PRN

## 2013-10-27 MED ORDER — PREDNISONE 10 MG PO TABS: 20.0000 mg | ORAL_TABLET | Freq: Every day | ORAL | Status: DC

## 2013-10-27 NOTE — Discharge Instructions (Signed)
Your spinal fluid testing is not show any signs of meningitis. Your strep test was negative. Rest, Tylenol or Motrin, Vicodin for pain. We will contact you if your strep test for spinal fluid culture turns positive. Prednisone to help with the pain and stiffness in your neck.  Fever, Adult A fever is a higher than normal body temperature. In an adult, an oral temperature around 98.6 F (37 C) is considered normal. A temperature of 100.4 F (38 C) or higher is generally considered a fever. Mild or moderate fevers generally have no long-term effects and often do not require treatment. Extreme fever (greater than or equal to 106 F or 41.1 C) can cause seizures. The sweating that may occur with repeated or prolonged fever may cause dehydration. Elderly people can develop confusion during a fever. A measured temperature can vary with:  Age.  Time of day.  Method of measurement (mouth, underarm, rectal, or ear). The fever is confirmed by taking a temperature with a thermometer. Temperatures can be taken different ways. Some methods are accurate and some are not.  An oral temperature is used most commonly. Electronic thermometers are fast and accurate.  An ear temperature will only be accurate if the thermometer is positioned as recommended by the manufacturer.  A rectal temperature is accurate and done for those adults who have a condition where an oral temperature cannot be taken.  An underarm (axillary) temperature is not accurate and not recommended. Fever is a symptom, not a disease.  CAUSES   Infections commonly cause fever.  Some noninfectious causes for fever include:  Some arthritis conditions.  Some thyroid or adrenal gland conditions.  Some immune system conditions.  Some types of cancer.  A medicine reaction.  High doses of certain street drugs such as methamphetamine.  Dehydration.  Exposure to high outside or room temperatures.  Occasionally, the source of a  fever cannot be determined. This is sometimes called a "fever of unknown origin" (FUO).  Some situations may lead to a temporary rise in body temperature that may go away on its own. Examples are:  Childbirth.  Surgery.  Intense exercise. HOME CARE INSTRUCTIONS   Take appropriate medicines for fever. Follow dosing instructions carefully. If you use acetaminophen to reduce the fever, be careful to avoid taking other medicines that also contain acetaminophen. Do not take aspirin for a fever if you are younger than age 3. There is an association with Reye's syndrome. Reye's syndrome is a rare but potentially deadly disease.  If an infection is present and antibiotics have been prescribed, take them as directed. Finish them even if you start to feel better.  Rest as needed.  Maintain an adequate fluid intake. To prevent dehydration during an illness with prolonged or recurrent fever, you may need to drink extra fluid.Drink enough fluids to keep your urine clear or pale yellow.  Sponging or bathing with room temperature water may help reduce body temperature. Do not use ice water or alcohol sponge baths.  Dress comfortably, but do not over-bundle. SEEK MEDICAL CARE IF:   You are unable to keep fluids down.  You develop vomiting or diarrhea.  You are not feeling at least partly better after 3 days.  You develop new symptoms or problems. SEEK IMMEDIATE MEDICAL CARE IF:   You have shortness of breath or trouble breathing.  You develop excessive weakness.  You are dizzy or you faint.  You are extremely thirsty or you are making little or no urine.  You  develop new pain that was not there before (such as in the head, neck, chest, back, or abdomen).  You have persistant vomiting and diarrhea for more than 1 to 2 days.  You develop a stiff neck or your eyes become sensitive to light.  You develop a skin rash.  You have a fever or persistent symptoms for more than 2 to 3  days.  You have a fever and your symptoms suddenly get worse. MAKE SURE YOU:   Understand these instructions.  Will watch your condition.  Will get help right away if you are not doing well or get worse. Document Released: 11/12/2000 Document Revised: 08/11/2011 Document Reviewed: 03/20/2011 [employer] Patient Information 2014 [employer], [place].

## 2013-10-28 LAB — CULTURE, GROUP A STREP

## 2013-10-29 ENCOUNTER — Emergency Department (HOSPITAL_COMMUNITY)
Admission: EM | Admit: 2013-10-29 | Discharge: 2013-10-29 | Disposition: A | Discharge status: Home / Self Care | Payer: BC Managed Care – PPO | Attending: Emergency Medicine | Admitting: Emergency Medicine

## 2013-10-29 ENCOUNTER — Encounter (HOSPITAL_COMMUNITY): Payer: BC Managed Care – PPO | Admitting: Anesthesiology

## 2013-10-29 ENCOUNTER — Encounter (HOSPITAL_COMMUNITY): Payer: Self-pay | Admitting: Emergency Medicine

## 2013-10-29 ENCOUNTER — Emergency Department (HOSPITAL_COMMUNITY): Payer: BC Managed Care – PPO | Admitting: Anesthesiology

## 2013-10-29 DIAGNOSIS — Z79899 Other long term (current) drug therapy: Secondary | ICD-10-CM | POA: Insufficient documentation

## 2013-10-29 DIAGNOSIS — G971 Other reaction to spinal and lumbar puncture: Secondary | ICD-10-CM | POA: Insufficient documentation

## 2013-10-29 DIAGNOSIS — Z880 Allergy status to penicillin: Secondary | ICD-10-CM | POA: Insufficient documentation

## 2013-10-29 DIAGNOSIS — Z87891 Personal history of nicotine dependence: Secondary | ICD-10-CM | POA: Insufficient documentation

## 2013-10-29 DIAGNOSIS — IMO0002 Reserved for concepts with insufficient information to code with codable children: Secondary | ICD-10-CM | POA: Insufficient documentation

## 2013-10-29 MED ORDER — HYDROCODONE-ACETAMINOPHEN 5-325 MG PO TABS: 1.0000 | ORAL_TABLET | Freq: Four times a day (QID) | ORAL | Status: DC | PRN

## 2013-10-29 MED ORDER — DIPHENHYDRAMINE HCL 50 MG/ML IJ SOLN
25.0000 mg | Freq: Once | INTRAMUSCULAR | Status: AC
  Administered 2013-10-29: 25 mg via INTRAVENOUS
  Filled 2013-10-29: qty 1

## 2013-10-29 MED ORDER — CAFFEINE-SODIUM BENZOATE 125-125 MG/ML IJ SOLN: 500.0000 mg | Freq: Once | INTRAMUSCULAR | Status: DC

## 2013-10-29 MED ORDER — KETOROLAC TROMETHAMINE 30 MG/ML IJ SOLN
30.0000 mg | Freq: Once | INTRAMUSCULAR | Status: AC
  Administered 2013-10-29: 30 mg via INTRAVENOUS
  Filled 2013-10-29: qty 1

## 2013-10-29 MED ORDER — FENTANYL CITRATE 0.05 MG/ML IJ SOLN
INTRAMUSCULAR | Status: DC | PRN
  Administered 2013-10-29 (×2): 50 ug via INTRAVENOUS

## 2013-10-29 MED ORDER — SODIUM CHLORIDE 0.9 % IV BOLUS (SEPSIS)
1000.0000 mL | Freq: Once | INTRAVENOUS | Status: AC
  Administered 2013-10-29: 1000 mL via INTRAVENOUS

## 2013-10-29 MED ORDER — FENTANYL CITRATE 0.05 MG/ML IJ SOLN: 100.0000 ug | Freq: Once | INTRAMUSCULAR | Status: DC | PRN

## 2013-10-29 MED ORDER — FENTANYL CITRATE 0.05 MG/ML IJ SOLN
INTRAMUSCULAR | Status: AC
  Filled 2013-10-29: qty 2

## 2013-10-29 MED ORDER — LACTATED RINGERS IV SOLN
INTRAVENOUS | Status: DC | PRN
  Administered 2013-10-29 (×2): via INTRAVENOUS

## 2013-10-29 MED ORDER — METOCLOPRAMIDE HCL 5 MG/ML IJ SOLN
10.0000 mg | Freq: Once | INTRAMUSCULAR | Status: AC
  Administered 2013-10-29: 10 mg via INTRAVENOUS
  Filled 2013-10-29: qty 2

## 2013-10-29 NOTE — ED Notes (Signed)
Bed: YI50 Expected date:  Expected time:  Means of arrival:  Comments: Hold for OR patient Samantha Farley

## 2013-10-29 NOTE — ED Provider Notes (Signed)
CSN: [id number]     Arrival date & time 10/29/13  1437 History   First MD Initiated Contact with Patient 10/29/13 1504     Chief Complaint  Patient presents with  . Headache     (Consider location/radiation/quality/duration/timing/severity/associated sxs/prior Treatment) Patient is a 33 y.o. female presenting with headaches. The history is provided by the patient.  Headache Pain location:  Frontal Quality:  Dull Radiates to:  L neck and R neck Severity currently:  2/10 Severity at highest:  10/10 Onset quality:  Gradual Duration:  3 days Timing:  Intermittent Progression:  Unchanged Chronicity:  New Similar to prior headaches: no   Context: not caffeine and not straining   Context comment:  LP 3 days ago Relieved by: lying down. Exacerbated by: being upright. Associated symptoms: nausea   Associated symptoms: no abdominal pain, no cough, no dizziness, no fever, no myalgias, no near-syncope, no numbness, no seizures, no sinus pressure, no URI and no vomiting     Past Medical History  Diagnosis Date  . Medical history non-contributory    Past Surgical History  Procedure Laterality Date  . Cesarean section    . Foot surgery Bilateral     Some type of surgery with the bones when pt was 33yo-33yo  . Skin biopsy     Family History  Problem Relation Age of Onset  . Hypertension Maternal Grandmother   . Heart disease Maternal Grandfather   . Hypertension Maternal Grandfather    History  Substance Use Topics  . Smoking status: Former Games developermoker  . Smokeless tobacco: Never Used  . Alcohol Use: No   OB History   Grav Para Term Preterm Abortions TAB SAB Ect Mult Living   2 2 2  0 0 0 0 0 0 2     Review of Systems  Constitutional: Negative for fever.  HENT: Negative for sinus pressure.   Respiratory: Negative for cough and shortness of breath.   Cardiovascular: Negative for chest pain, leg swelling and near-syncope.  Gastrointestinal: Positive for nausea. Negative for  vomiting and abdominal pain.  Musculoskeletal: Negative for myalgias.  Neurological: Positive for headaches. Negative for dizziness, seizures and numbness.  All other systems reviewed and are negative.     Allergies  Amoxil  Home Medications   Prior to Admission medications   Medication Sig Start Date End Date Taking? Authorizing Provider  acetaminophen (TYLENOL) 500 MG tablet Take 1,000 mg by mouth every 6 (six) hours as needed for pain.    Historical Provider, MD  cetirizine (ZYRTEC) 10 MG tablet Take 10 mg by mouth daily as needed for allergies.    Historical Provider, MD  HYDROcodone-acetaminophen (NORCO/VICODIN) 5-325 MG per tablet Take 2 tablets by mouth every 4 (four) hours as needed. 10/27/13   Rolland PorterMark James, MD  ibuprofen (ADVIL,MOTRIN) 400 MG tablet Take 400 mg by mouth every 6 (six) hours as needed for headache or moderate pain.    Historical Provider, MD  norgestimate-ethinyl estradiol (ORTHO-CYCLEN,SPRINTEC,PREVIFEM) 0.25-35 MG-MCG tablet Take 1 tablet by mouth daily.    Historical Provider, MD  predniSONE (DELTASONE) 10 MG tablet Take 2 tablets (20 mg total) by mouth daily. 10/27/13   Rolland PorterMark James, MD   BP 125/84  Pulse 77  Temp(Src) 97.8 F (36.6 C) (Oral)  Resp 14  SpO2 98%  LMP 10/23/2013  Breastfeeding? No Physical Exam  Nursing note and vitals reviewed. Constitutional: She is oriented to person, place, and time. She appears well-developed and well-nourished. No distress.  HENT:  Head: Normocephalic  and atraumatic.  Eyes: EOM are normal. Pupils are equal, round, and reactive to light.  Neck: Normal range of motion. Neck supple.  Cardiovascular: Normal rate and regular rhythm.  Exam reveals no friction rub.   No murmur heard. Pulmonary/Chest: Effort normal and breath sounds normal. No respiratory distress. She has no wheezes. She has no rales.  Abdominal: Soft. She exhibits no distension. There is no tenderness. There is no rebound.  Musculoskeletal: Normal range of  motion. She exhibits no edema.  Neurological: She is alert and oriented to person, place, and time.  Skin: No rash noted. She is not diaphoretic.    ED Course  Procedures (including critical care time) Labs Review Labs Reviewed - No data to display  Imaging Review No results found.   EKG Interpretation None      MDM   Final diagnoses:  Spinal headache    41F presents with headache. Patient had LP 3 days ago, has had persistent headache. Has spent the past few days in bed, realized HA improves with lying flat, worse with being upright. Took caffeine this morning, 400 mg orally and a cup of coffee, without any alleviation of her headache. Denies neurologic symptoms, no photophobia, no vision issues. Exam benign. Likely post-LP headache. Will try headache cocktail, then caffeine again is headache cocktail doesn't improve her headache.  On re-exam 35 minutes after meds, sitting up, eating a sandwich, doing better. Will continue observation. On re-exam 30 minutes later, headache returning. Dr. Malen Gauze with Anesthesia contacted and will do blood patch. Blood patch done in the PACU with great relief.  Discharged after 2 hours of observation, doing great. Vicodin given for back pain. Stable for discharge.  Dagmar Hait, MD 10/29/13 [phone]

## 2013-10-29 NOTE — Discharge Instructions (Signed)
Spinal Headache, Conservative Treatment  Sometimes following a spinal tap (lumbar puncture) or an epidural, there may be CSF (cerebrospinal fluid) leakage through a hole in the dura. The dura is one of the protective membranes covering the brain and spinal cord. This leakage produces low CSF pressure. Pressure on the pain sensitive structures in the brain and relaxation (dilating) of the vessels in the head when the patient is upright is thought to cause headaches. The headache usually lasts until the hole heals and CSF pressure is restored. This can last a few days and rarely more than one week.  THERAPEUTIC ALTERNATIVES TO EPIDURAL BLOOD PATCHING INCLUDE:  · Bedrest: The symptoms of PDPH are lessened by lying down on your back. Lying on your back for a period of time (such as 24 hours) after a dural puncture has no preventative effect. It only delays the start of the PDPH.  · Hydration: Normal taking in fluids (hydration) should be maintained. Extra hydration does not alleviate the headache, but dehydration may make symptoms worse.  · Analgesics: Narcotic analgesics and, in some instances, non-steroidal anti-inflammatory agents are often given for treatment of the headache pain.  · Caffeine: Caffeine intake is a therapy to help shrink the cerebral vessels. Patients should have caffeine early in the day so that he/she can sleep at night. 500 mg of Caffeine sodium benzoate can be given in the vein (intravenously). It can be given once two hours later if the first dose does not have the desired effect. Caffeinated beverages (colas, tea, coffee) can be somewhat effective also.  · Epidural Saline Injection: Large-volume shots (boluses) or infusion of epidural normal saline can help to quickly and temporarily increase the epidural pressure. The infusions slow the speed at which CSF leaks through the dural hole. This may speed the natural healing process. Although epidural saline can be a useful technique, epidural blood  patches often have a higher success rate.  SEEK IMMEDIATE MEDICAL ATTENTION IF:   · You do not get relief from the medications given to you or your pain becomes severe.  · You have an unexplained oral temperature above 102° F (38.9° C), or as your caregiver suggests.  · You have a stiff neck.  · You lose bowel or bladder control.  · You develop severe symptoms different from your first symptoms.  · You have trouble walking.  Document Released: 11/08/2001 Document Revised: 08/11/2011 Document Reviewed: 12/09/2012  ExitCare® Patient Information ©2014 [employer].

## 2013-10-29 NOTE — ED Notes (Signed)
Pt reports spinal tap Wednesday night here for stiff neck, fever, HA. Tap clean, sent home with meds, did strict 24 hr bedrest. When lying flat, no HA, upon standing c/o severe HA. Concerned for spinal HA.

## 2013-10-29 NOTE — Transfer of Care (Signed)
Immediate Anesthesia Transfer of Care Note  Patient: Samantha Farley   Patient Location:Transported to ER by Dr. Malen Gauze.  Report  Given by Dr. Malen Gauze

## 2013-10-29 NOTE — Anesthesia Procedure Notes (Addendum)
Epidural  Start time: 10/29/2013 6:08 PM End time: 10/29/2013 6:20 PM  Staffing Anesthesiologist: Temperence Zenor A. Performed by: anesthesiologist   Preanesthetic Checklist Completed: patient identified, site marked, surgical consent, pre-op evaluation, timeout performed, IV checked, risks and benefits discussed and monitors and equipment checked  Epidural Patient position: sitting Prep: DuraPrep Patient monitoring: blood pressure, continuous pulse ox and cardiac monitor Approach: midline Location: L4-L5 Injection technique: LOR air  Needle:  Needle type: Tuohy  Needle gauge: 18 G Needle length: 9 cm and 0 Needle insertion depth: 4 cm  Additional Notes Monitors were attached.She was given Fentanyl IV prior to the procedure. After reaching the epidural space with a 18ga Touhy needle using LOR with air, 83ml of whole blood was withdrawn from a vein on the dorsum of her right hand. The 21ml of blood was then injected into the epidural needle and the needle was withdrawn. She tolerated the procedure well and was taken back to the ED after aprroximately 45 min.Reason for block:Epidural Blood Patch

## 2013-10-29 NOTE — Anesthesia Preprocedure Evaluation (Signed)
Anesthesia Evaluation  Patient identified by MRN, date of birth, ID band Patient awake    Reviewed: Allergy & Precautions, H&P , Patient's Chart, lab work & pertinent test results  Airway Mallampati: II TM Distance: >3 FB Neck ROM: Full    Dental no notable dental hx. (+) Teeth Intact   Pulmonary former smoker,  breath sounds clear to auscultation  Pulmonary exam normal       Cardiovascular negative cardio ROS  Rhythm:Regular Rate:Normal  Had some cervical lymphadenopathy earlier in the week which seems to be resolving.   Neuro/Psych  Headaches, Severe HA began yesterday pm. Had LP on 5/27 pm in ED for w/u for HA, fever and nuchal rigidity. Workup was negative. She developed a different type of HA which was positional in nature and consistent with PDPHA. She tried caffeine and analgesics without success. I was contacted by Dr. Gwendolyn Grant in the ED to evaluate for LEBP. negative psych ROS   GI/Hepatic negative GI ROS, Neg liver ROS,   Endo/Other  negative endocrine ROS  Renal/GU   negative genitourinary   Musculoskeletal negative musculoskeletal ROS (+)   Abdominal   Peds  Hematology negative hematology ROS (+)   Anesthesia Other Findings   Reproductive/Obstetrics negative OB ROS                           Anesthesia Physical Anesthesia Plan  ASA: I  Anesthesia Plan: Epidural   Post-op Pain Management:    Induction:   Airway Management Planned:   Additional Equipment:   Intra-op Plan:   Post-operative Plan:   Informed Consent: I have reviewed the patients History and Physical, chart, labs and discussed the procedure including the risks, benefits and alternatives for the proposed anesthesia with the patient or authorized representative who has indicated his/her understanding and acceptance.     Plan Discussed with: Anesthesiologist and CRNA  Anesthesia Plan Comments: (I discussed in  detail the probable etiology of her HA now being from the LP she had on Wed. Evening. I have discussed the risks, benefits and alternatives of a lumbar epidural blood patch with the patient. She wishes to proceed with the procedure.)        Anesthesia Quick Evaluation

## 2013-10-30 LAB — CSF CULTURE: CULTURE: NO GROWTH

## 2013-10-30 LAB — CSF CULTURE W GRAM STAIN
Gram Stain: NONE SEEN
Special Requests: NORMAL

## 2013-10-30 NOTE — Anesthesia Postprocedure Evaluation (Signed)
  Anesthesia Post-op Note  Patient: Samantha Farley  Procedure(s) Performed: Lumbar Epidural Blood Patch  Patient Location:ED   Anesthesia Type:Epidural  Level of Consciousness: awake, alert  and oriented  Airway and Oxygen Therapy: Patient Spontanous Breathing  Post-op Pain: none  Post-op Assessment: Post-op Vital signs reviewed, Patient's Cardiovascular Status Stable, Respiratory Function Stable, Patent Airway, No signs of Nausea or vomiting, Pain level controlled, No headache and No backache. Patient states she feels much better and HA has resolved.  Post-op Vital Signs: Reviewed and stable  Last Vitals:  Filed Vitals:   10/29/13 1845  Pulse: 67    Complications: No apparent anesthesia complications

## 2014-04-03 ENCOUNTER — Encounter (HOSPITAL_COMMUNITY): Payer: Self-pay | Admitting: Emergency Medicine

## 2014-05-24 ENCOUNTER — Ambulatory Visit: Payer: BC Managed Care – PPO | Admitting: Neurology

## 2014-06-21 ENCOUNTER — Ambulatory Visit: Payer: BC Managed Care – PPO | Admitting: Neurology

## 2014-06-22 ENCOUNTER — Ambulatory Visit: Payer: BC Managed Care – PPO | Admitting: Neurology

## 2014-06-28 ENCOUNTER — Ambulatory Visit (INDEPENDENT_AMBULATORY_CARE_PROVIDER_SITE_OTHER): Payer: BLUE CROSS/BLUE SHIELD | Admitting: Neurology

## 2014-06-28 ENCOUNTER — Encounter: Payer: Self-pay | Admitting: Neurology

## 2014-06-28 DIAGNOSIS — R41840 Attention and concentration deficit: Secondary | ICD-10-CM

## 2014-06-28 NOTE — Progress Notes (Signed)
[facility]    Provider:  Dr Lucia Gaskins Referring Provider: No ref. provider found Primary Care Physician:  REDMON,NOELLE, PA-C  CC:  Follow up for decrease in concentration  HPI:  Samantha Farley is a 34 y.o. female here as a follow up. She was evaluated in clinic in September for mild decrease in concentration. At the time, she was dealing with temporary life stressors. She had formal, extensive Neuropsychiatric testing which was normal. She was evaluated at [facility] and etiology was determined to be due to increase in responsibilities and temporary stressors. 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 Since these issues in her life have resolved, she is feeling better and she feels back to baseline.  She is much improved, cognitively she feels back to baseline.  Denies anxiety, denies depressive symptoms.    Reviewed notes, labs and imaging from outside physicians: Lumbar puncture in 09/2013 demonstrates normal cell count and diff, glucose, protein, gram stain. B12 and TSH normal. MRI of the brain was normal: no acute intracranial abnormalities including mass lesion or mass effect, hydrocephalus, extra-axial fluid collection, midline shift, hemorrhage, or acute infarction, large ischemic events (personally reviewed images)    Review of Systems: Patient complains of symptoms per HPI as well as the following symptoms no CP, no SOB. Pertinent negatives per HPI. All others negative.   History   Social History  . Marital Status: Married    Spouse Name: Molly Maduro    Number of Children: 2  . Years of Education: DVM   Occupational History  . Not on file.   Social History Main Topics  . Smoking status: Former Smoker -- 0.15 packs/day for 4  years    Types: Cigarettes    Quit date: 06/03/1999  . Smokeless tobacco: Never Used  . Alcohol Use: 0.0 oz/week    0 Not specified per week     Comment: occ  . Drug Use: No  . Sexual Activity: Yes     Comment: pregnant   Other Topics Concern  . Not on file   Social History Narrative   Patient lives at home with family.   Caffeine Use: 1-2 cups daily    Family History  Problem Relation Age of Onset  . Hypertension Maternal Grandmother   . Heart disease Maternal Grandfather   . Hypertension Maternal Grandfather   . Healthy Mother   . Healthy Father     Past Medical History  Diagnosis Date  . Medical history non-contributory     Past Surgical History  Procedure Laterality Date  . Cesarean section    . Foot surgery Bilateral     Some type of surgery with the bones when pt was 34yo-34yo  . Skin biopsy      Current Outpatient Prescriptions  Medication Sig Dispense Refill  . acetaminophen (TYLENOL) 500 MG tablet Take 1,000 mg by mouth every 6 (six) hours as needed for pain.    . cetirizine (ZYRTEC) 10 MG tablet Take 10 mg by mouth daily as needed for allergies.    Marland Kitchen ibuprofen (ADVIL,MOTRIN) 200 MG tablet Take 400 mg by mouth every 6 (six) hours as needed for moderate pain.    . Levonorgestrel 13.5 MG IUD by Intrauterine route.  No current facility-administered medications for this visit.    Allergies as of 06/28/2014 - Review Complete 06/28/2014  Allergen Reaction Noted  . Amoxil [amoxicillin] Rash 07/21/2012    Vitals: BP 123/79 mmHg  Pulse 86  Ht 5' 6.5" (1.689 m)  Wt 155 lb (70.308 kg)  BMI 24.65 kg/m2 Last Weight:  Wt Readings from Last 1 Encounters:  06/28/14 155 lb (70.308 kg)   Last Height:   Ht Readings from Last 1 Encounters:  06/28/14 5' 6.5" (1.689 m)    Physical exam: Exam: Gen: NAD, conversant, well nourised                     CV: RRR, no MRG. No Carotid Bruits. No peripheral edema, warm, nontender Eyes: Conjunctivae clear without  exudates or hemorrhage  Neuro: Detailed Neurologic Exam  Speech:    Speech is normal; fluent and spontaneous with normal comprehension.  Cognition:MoCA 30/30    The patient is oriented to person, place, and time;     recent and remote memory intact;     language fluent;     normal attention, concentration,     fund of knowledge Cranial Nerves:    The pupils are equal, round, and reactive to light. The fundi are normal and spontaneous venous pulsations are present. Visual fields are full to finger confrontation. Extraocular movements are intact. Trigeminal sensation is intact and the muscles of mastication are normal. Slight asymmetry of face which is chronic and within normal limits. The palate elevates in the midline. Hearing intact. Voice is normal. Shoulder shrug is normal. The tongue has normal motion without fasciculations.   Coordination:    Normal finger to nose and heel to shin. Normal rapid alternating movements.   Gait:    Heel-toe and tandem gait are normal.   Motor Observation:    No asymmetry, no atrophy, and no involuntary movements noted. Tone:    Normal muscle tone.    Posture:    Posture is normal. normal erect    Strength:    Strength is V/V in the upper and lower limbs.      Sensation: intact to LT     Reflex Exam:  DTR's:    Deep tendon reflexes in the upper and lower extremities are normal bilaterally.   Toes:    The toes are downgoing bilaterally.   Clonus:    Clonus is absent.  Assessment/Plan:  34 year old female here for follow up.  She was evaluated in clinic in September for mild decrease in concentration. At the time, she was dealing with temporary life stressors. She had formal, extensive Neuropsychiatric testing which was normal. [facility]. 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 Since these have resolved, she is feeling better and she is back to her usual cognitive baseline.    Her decreased concentration was likely due to situational stressors which are now resolved. Montreal Cognitive Assessment (MoCA) demonstrates perfect score, 30/30.  Patient's neurologic exam is normal anddemonstrates appropriate affect, insight, fund of knowledge, concentration, attention, remote and recent memory. No neurologic deficits. Extensive neuropsychiatric testing showed normal cognition without any deficits in memory or information processing. Follow up in neurology clinic as needed.  Naomie Dean, MD  [facility] [address] [place], [place] [address]  Phone [phone] Fax [phone]   A total of 45 minutes was spent in with this patient. Over half this time was spent on counseling patient on temporary loss of concentration that is now resolved.

## 2014-08-01 DIAGNOSIS — Z0289 Encounter for other administrative examinations: Secondary | ICD-10-CM

## 2014-08-08 DIAGNOSIS — Z0289 Encounter for other administrative examinations: Secondary | ICD-10-CM

## 2016-02-13 ENCOUNTER — Other Ambulatory Visit: Payer: Self-pay | Admitting: Obstetrics and Gynecology

## 2016-02-13 ENCOUNTER — Other Ambulatory Visit (HOSPITAL_COMMUNITY)
Admission: RE | Admit: 2016-02-13 | Discharge: 2016-02-13 | Disposition: A | Discharge status: Home / Self Care | Payer: 59 | Source: Ambulatory Visit | Attending: Obstetrics and Gynecology | Admitting: Obstetrics and Gynecology

## 2016-02-13 DIAGNOSIS — Z01419 Encounter for gynecological examination (general) (routine) without abnormal findings: Secondary | ICD-10-CM | POA: Insufficient documentation

## 2016-02-13 DIAGNOSIS — Z1151 Encounter for screening for human papillomavirus (HPV): Secondary | ICD-10-CM | POA: Insufficient documentation

## 2016-02-14 LAB — CYTOLOGY - PAP

## 2019-03-09 ENCOUNTER — Other Ambulatory Visit: Payer: Self-pay | Admitting: Obstetrics and Gynecology

## 2019-03-09 ENCOUNTER — Other Ambulatory Visit (HOSPITAL_COMMUNITY)
Admission: RE | Admit: 2019-03-09 | Discharge: 2019-03-09 | Disposition: A | Discharge status: Home / Self Care | Payer: No Typology Code available for payment source | Source: Ambulatory Visit | Attending: Obstetrics and Gynecology | Admitting: Obstetrics and Gynecology

## 2019-03-09 DIAGNOSIS — Z124 Encounter for screening for malignant neoplasm of cervix: Secondary | ICD-10-CM | POA: Insufficient documentation

## 2019-03-09 DIAGNOSIS — N6310 Unspecified lump in the right breast, unspecified quadrant: Secondary | ICD-10-CM

## 2019-03-16 ENCOUNTER — Other Ambulatory Visit: Payer: 59

## 2019-03-17 ENCOUNTER — Other Ambulatory Visit: Payer: Self-pay

## 2019-03-17 ENCOUNTER — Ambulatory Visit
Admission: RE | Admit: 2019-03-17 | Discharge: 2019-03-17 | Disposition: A | Discharge status: Home / Self Care | Payer: No Typology Code available for payment source | Source: Ambulatory Visit | Attending: Obstetrics and Gynecology | Admitting: Obstetrics and Gynecology

## 2019-03-17 ENCOUNTER — Ambulatory Visit
Admission: RE | Admit: 2019-03-17 | Discharge: 2019-03-17 | Disposition: A | Discharge status: Home / Self Care | Payer: 59 | Source: Ambulatory Visit | Attending: Obstetrics and Gynecology | Admitting: Obstetrics and Gynecology

## 2019-03-17 DIAGNOSIS — N6310 Unspecified lump in the right breast, unspecified quadrant: Secondary | ICD-10-CM

## 2019-03-21 LAB — CYTOLOGY - PAP
Comment: NEGATIVE
Diagnosis: NEGATIVE
High risk HPV: NEGATIVE

## 2019-03-30 ENCOUNTER — Other Ambulatory Visit: Payer: Self-pay

## 2019-03-30 ENCOUNTER — Emergency Department (HOSPITAL_COMMUNITY)
Admission: EM | Admit: 2019-03-30 | Discharge: 2019-03-31 | Discharge status: Against Medical Advice | Payer: No Typology Code available for payment source | Attending: Emergency Medicine | Admitting: Emergency Medicine

## 2019-03-30 DIAGNOSIS — Z5321 Procedure and treatment not carried out due to patient leaving prior to being seen by health care provider: Secondary | ICD-10-CM | POA: Diagnosis not present

## 2019-03-30 DIAGNOSIS — R1031 Right lower quadrant pain: Secondary | ICD-10-CM | POA: Diagnosis present

## 2019-03-30 LAB — URINALYSIS, ROUTINE W REFLEX MICROSCOPIC
Bilirubin Urine: NEGATIVE
Glucose, UA: NEGATIVE mg/dL
Ketones, ur: 20 mg/dL — AB
Nitrite: NEGATIVE
Protein, ur: NEGATIVE mg/dL
Specific Gravity, Urine: 1.026 (ref 1.005–1.030)
pH: 5 (ref 5.0–8.0)

## 2019-03-30 LAB — I-STAT BETA HCG BLOOD, ED (MC, WL, AP ONLY): I-stat hCG, quantitative: <5 m[IU]/mL (ref <5)

## 2019-03-30 LAB — COMPREHENSIVE METABOLIC PANEL
ALT: 20 U/L (ref 0–44)
AST: 22 U/L (ref 15–41)
Albumin: 4 g/dL (ref 3.5–5.0)
Alkaline Phosphatase: 43 U/L (ref 38–126)
Anion gap: 14 (ref 5–15)
BUN: 9 mg/dL (ref 6–20)
CO2: 19 mmol/L — ABNORMAL LOW (ref 22–32)
Calcium: 9 mg/dL (ref 8.9–10.3)
Chloride: 103 mmol/L (ref 98–111)
Creatinine, Ser: 0.54 mg/dL (ref 0.44–1.00)
GFR calc Af Amer: >60 mL/min (ref >60)
GFR calc non Af Amer: >60 mL/min (ref >60)
Glucose, Bld: 77 mg/dL (ref 70–99)
Potassium: 3.9 mmol/L (ref 3.5–5.1)
Sodium: 136 mmol/L (ref 135–145)
Total Bilirubin: 0.5 mg/dL (ref 0.3–1.2)
Total Protein: 7 g/dL (ref 6.5–8.1)

## 2019-03-30 LAB — CBC
HCT: 39.2 % (ref 36.0–46.0)
Hemoglobin: 12.5 g/dL (ref 12.0–15.0)
MCH: 27.4 pg (ref 26.0–34.0)
MCHC: 31.9 g/dL (ref 30.0–36.0)
MCV: 86 fL (ref 80.0–100.0)
Platelets: 323 10*3/uL (ref 150–400)
RBC: 4.56 MIL/uL (ref 3.87–5.11)
RDW: 14 % (ref 11.5–15.5)
WBC: 16.2 10*3/uL — ABNORMAL HIGH (ref 4.0–10.5)
nRBC: 0 % (ref 0.0–0.2)

## 2019-03-30 LAB — LIPASE, BLOOD: Lipase: 28 U/L (ref 11–51)

## 2019-03-30 MED ORDER — ACETAMINOPHEN 325 MG PO TABS
650.0000 mg | ORAL_TABLET | Freq: Once | ORAL | Status: AC
  Administered 2019-03-30: 650 mg via ORAL
  Filled 2019-03-30: qty 2

## 2019-03-30 NOTE — ED Notes (Signed)
Per Registration pt left 

## 2019-03-30 NOTE — ED Triage Notes (Signed)
Pt endorses lower mid right abd pain x 5 days, had CT scan earlier in the week that was unremarkable. Oral temp 99.9. Has IUD and concerned it may be a problem with that. Tachy.

## 2019-05-11 ENCOUNTER — Other Ambulatory Visit: Payer: Self-pay | Admitting: Obstetrics and Gynecology

## 2019-05-11 ENCOUNTER — Other Ambulatory Visit (HOSPITAL_COMMUNITY)
Admission: RE | Admit: 2019-05-11 | Discharge: 2019-05-11 | Disposition: A | Discharge status: Home / Self Care | Payer: No Typology Code available for payment source | Source: Ambulatory Visit | Attending: Obstetrics and Gynecology | Admitting: Obstetrics and Gynecology

## 2019-05-11 DIAGNOSIS — A429 Actinomycosis, unspecified: Secondary | ICD-10-CM | POA: Diagnosis present

## 2019-05-13 LAB — CYTOLOGY - PAP: Diagnosis: NEGATIVE

## 2020-08-10 IMAGING — US US BREAST*R* LIMITED INC AXILLA
1 series · 7 of 7 positions shown · non-contrast
Comparison: None.

CLINICAL DATA: 38-year-old female presenting for evaluation of a
palpable lump in the lateral right breast at 9 o'clock identified on
clinical breast exam.

EXAM:
DIGITAL DIAGNOSTIC BILATERAL MAMMOGRAM WITH CAD AND TOMO
ULTRASOUND RIGHT BREAST

[Series 1: us breast*right* limited inc axilla · 0.06mm/px · 7 of 7 slices shown]
[im 1/7]
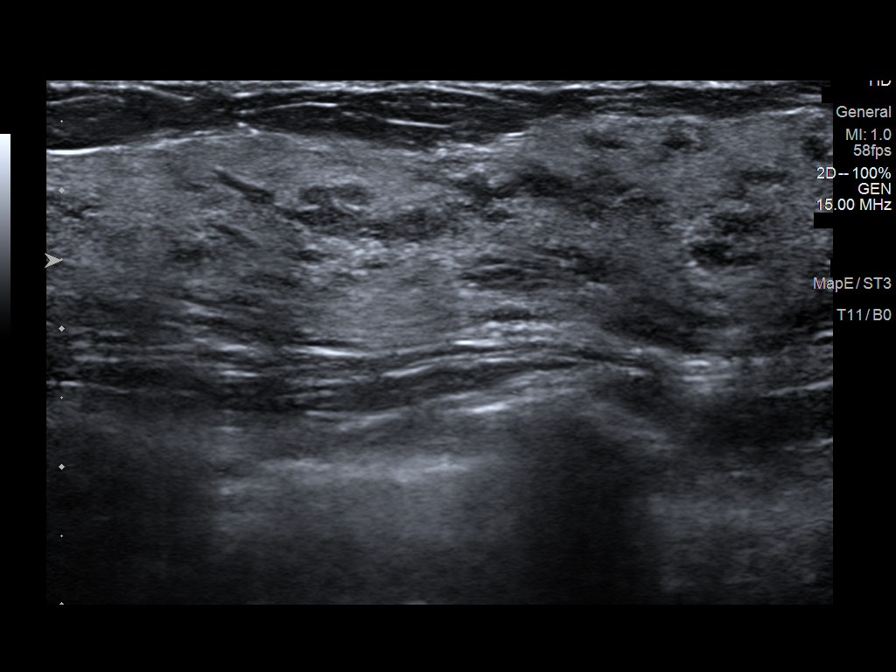
[im 2/7]
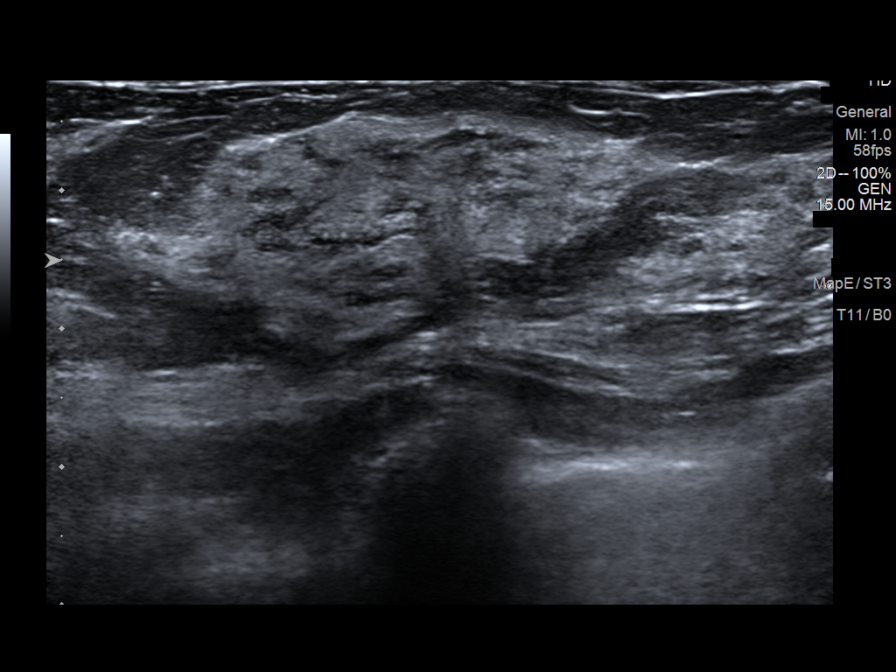
[im 3/7]
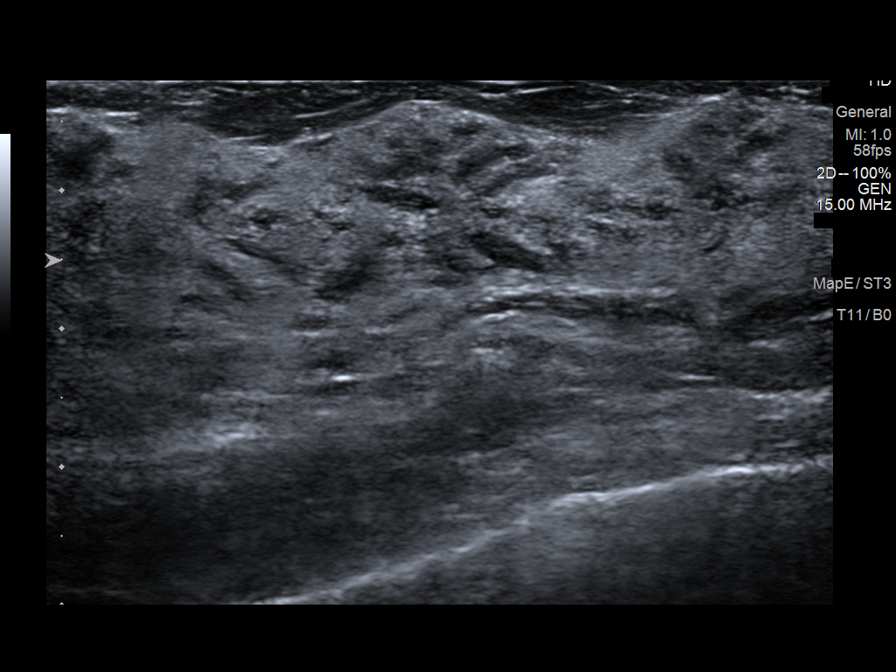
[im 4/7]
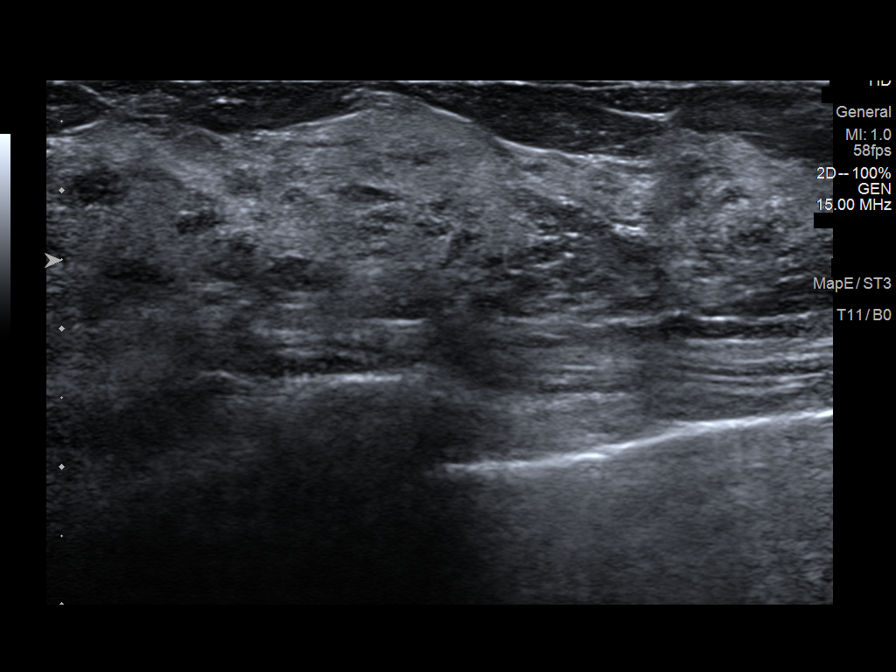
[im 5/7]
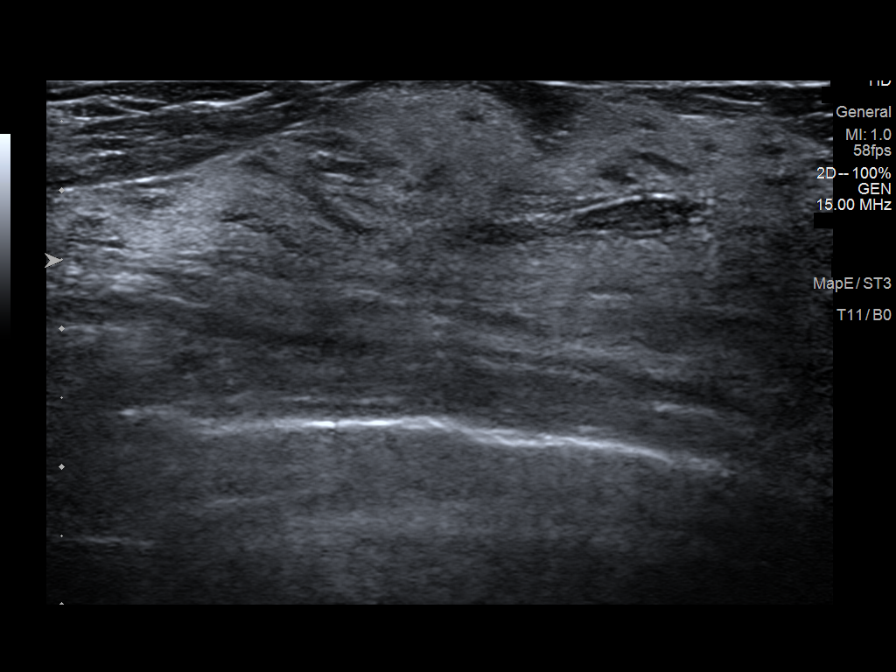
[im 6/7]
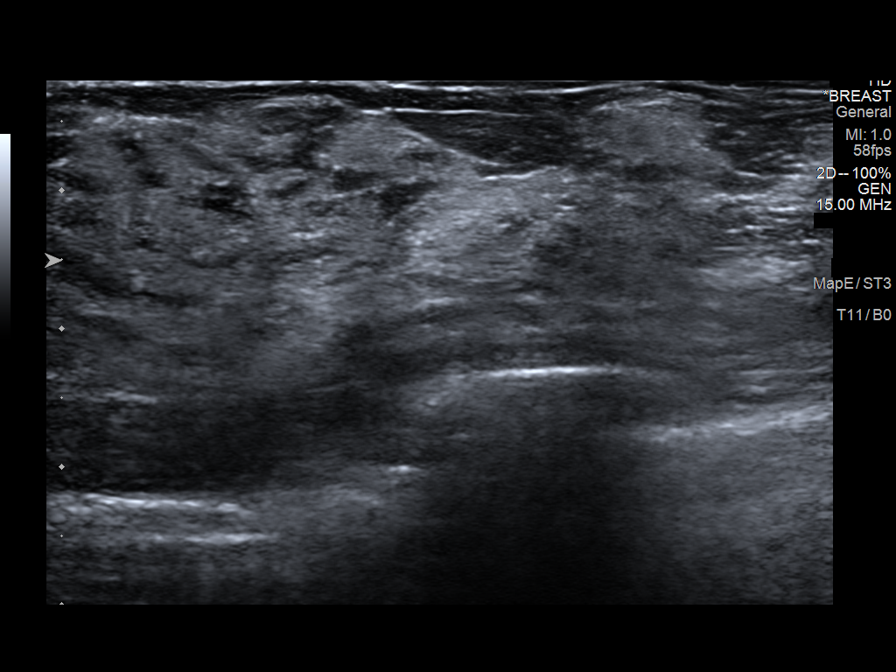
[im 7/7]
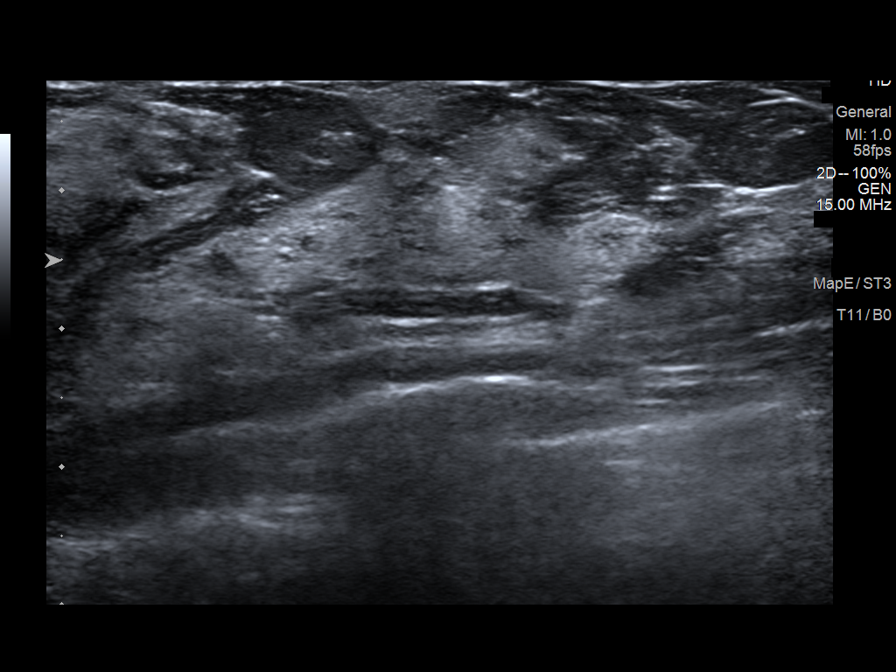

[7 of 7 positions shown; findings below may reference images not displayed]

ACR Breast Density Category d: The breast tissue is extremely dense,
which lowers the sensitivity of mammography.
FINDINGS: In the upper inner quadrant of the right breast there is a possible
obscured mass. However, on the spot compression tomosynthesis
images, no mass persists on the CC view and the same area on the MLO
spot images appears to represent normal fibroglandular tissue.
Ultrasound will be performed for further evaluation. No suspicious
mammographic findings are identified in the lateral aspect of the
right breast.

Mammographic images were processed with CAD.

On physical exam, nodular breast tissue is palpated in the lateral
to slightly lower outer quadrant of the right breast.

Targeted ultrasound is performed, showing normal fibroglandular
tissue in the lateral right breast. No suspicious masses or areas of
shadowing are identified. No suspicious findings are seen in the
superior to upper inner quadrant of the right breast at the site of
the asymmetry seen on the mammogram.
IMPRESSION: 1. There are no suspicious mammographic or targeted sonographic
abnormalities in the lateral right breast.

2.  No mammographic evidence of malignancy in the bilateral breasts.

RECOMMENDATION:
1. Clinical follow-up recommended for the palpable area of concern
in the lateral right breast. Any further workup should be based on
clinical grounds.

2. Screening mammogram at age 40 unless there are persistent or
intervening clinical concerns. (Code:7O-5-TUV)

I have discussed the findings and recommendations with the patient.
If applicable, a reminder letter will be sent to the patient
regarding the next appointment.

BI-RADS CATEGORY  1: Negative.

## 2020-08-10 IMAGING — MG DIGITAL DIAGNOSTIC BILAT W/ TOMO W/ CAD
6 of 12 series · 6 of 36 positions shown · non-contrast
Comparison: None.

CLINICAL DATA: 38-year-old female presenting for evaluation of a
palpable lump in the lateral right breast at 9 o'clock identified on
clinical breast exam.

EXAM:
DIGITAL DIAGNOSTIC BILATERAL MAMMOGRAM WITH CAD AND TOMO
ULTRASOUND RIGHT BREAST

[R MLO synth-2D (1 of 2)]
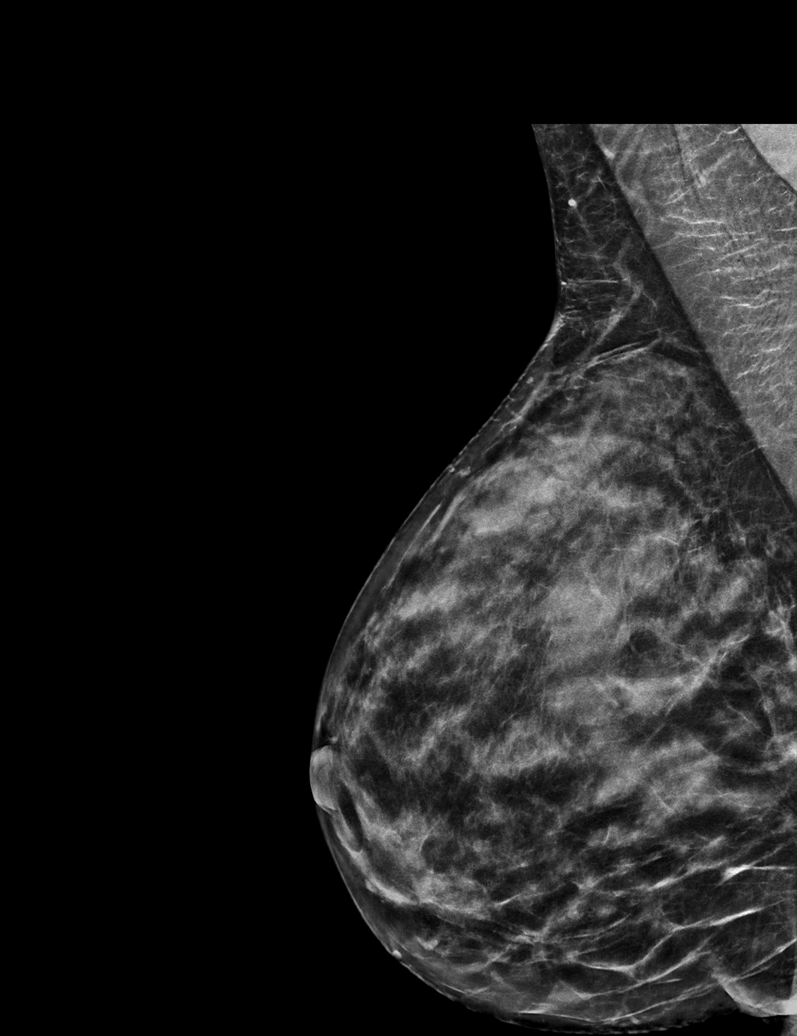

[L MLO synth-2D]
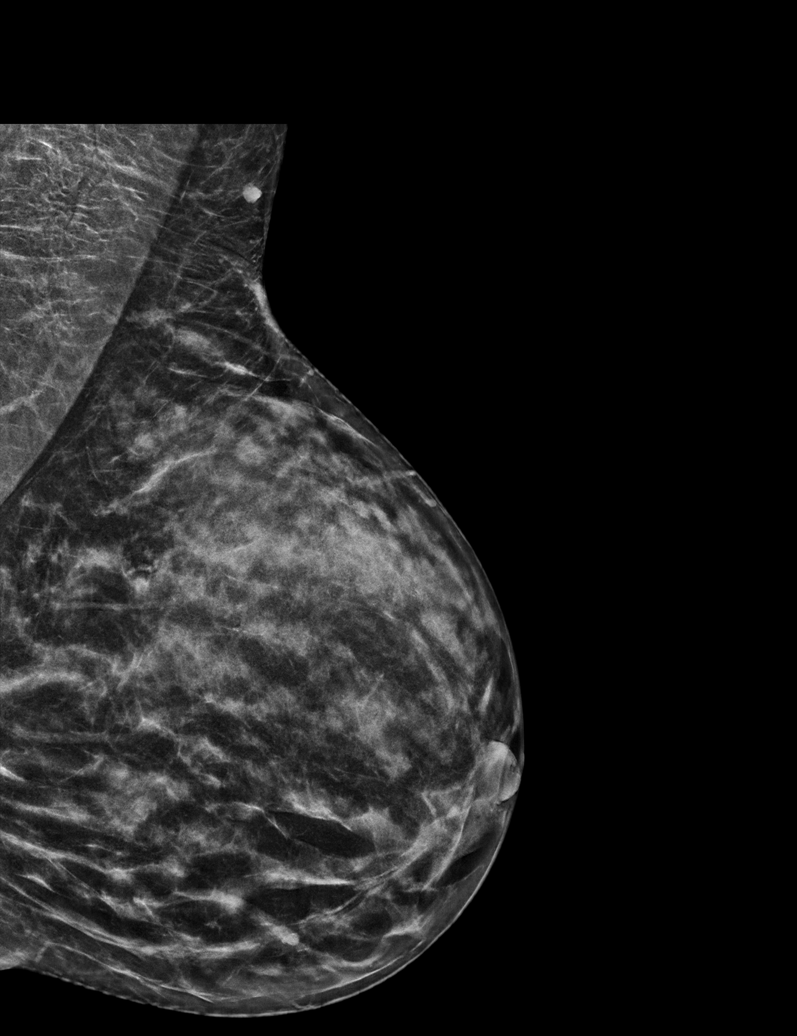

[R CC synth-2D (1 of 2)]
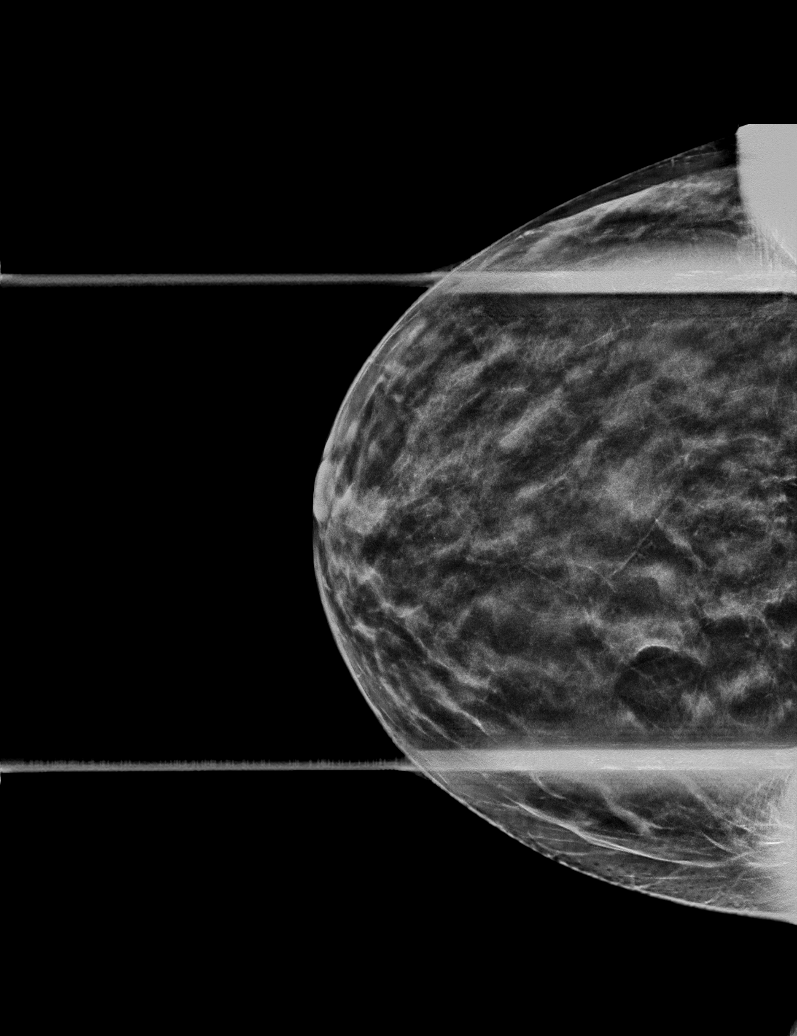

[R CC synth-2D (2 of 2)]
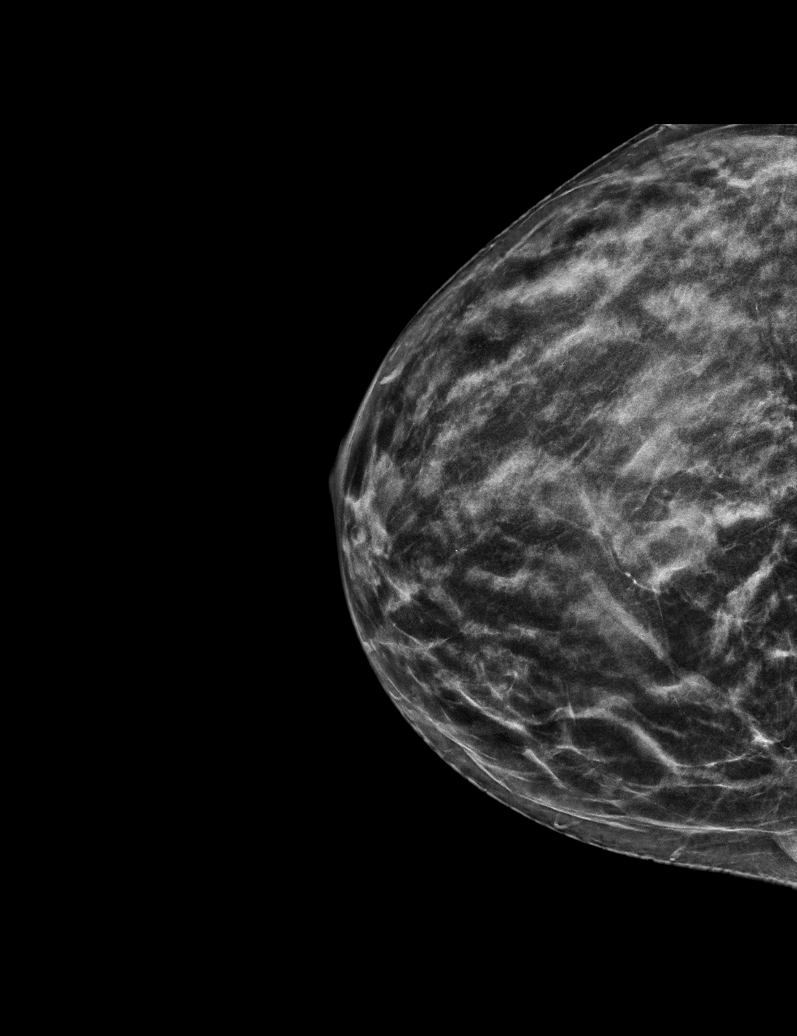

[R MLO synth-2D (2 of 2)]
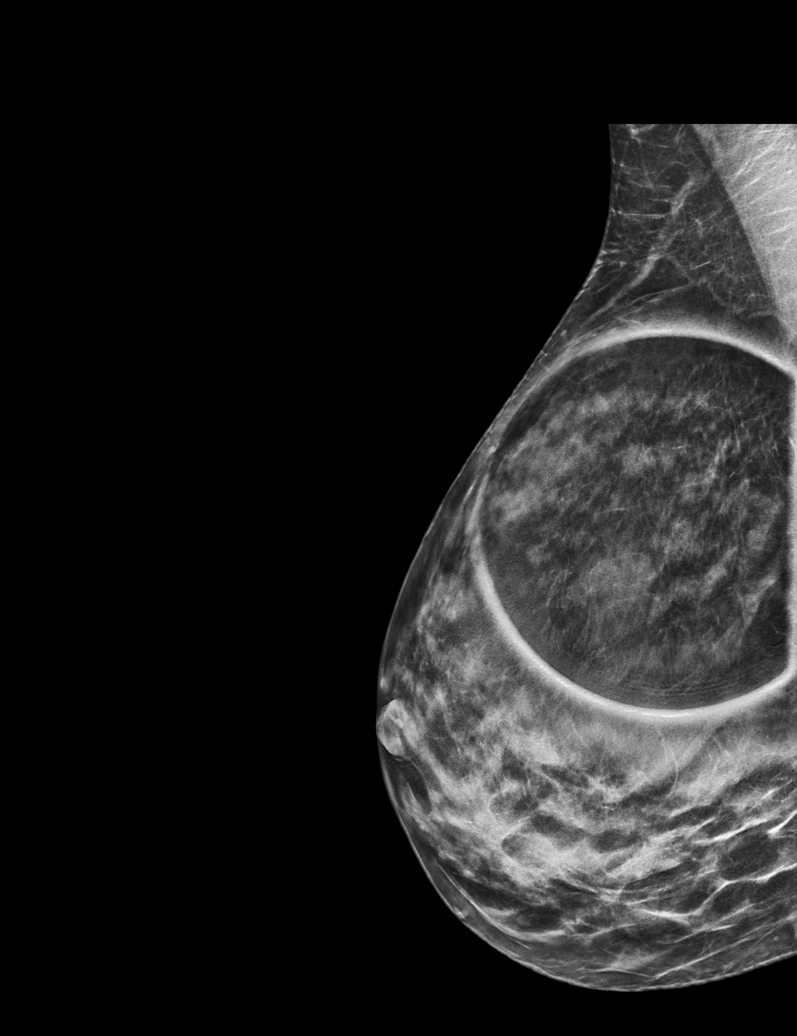

[L CC synth-2D]
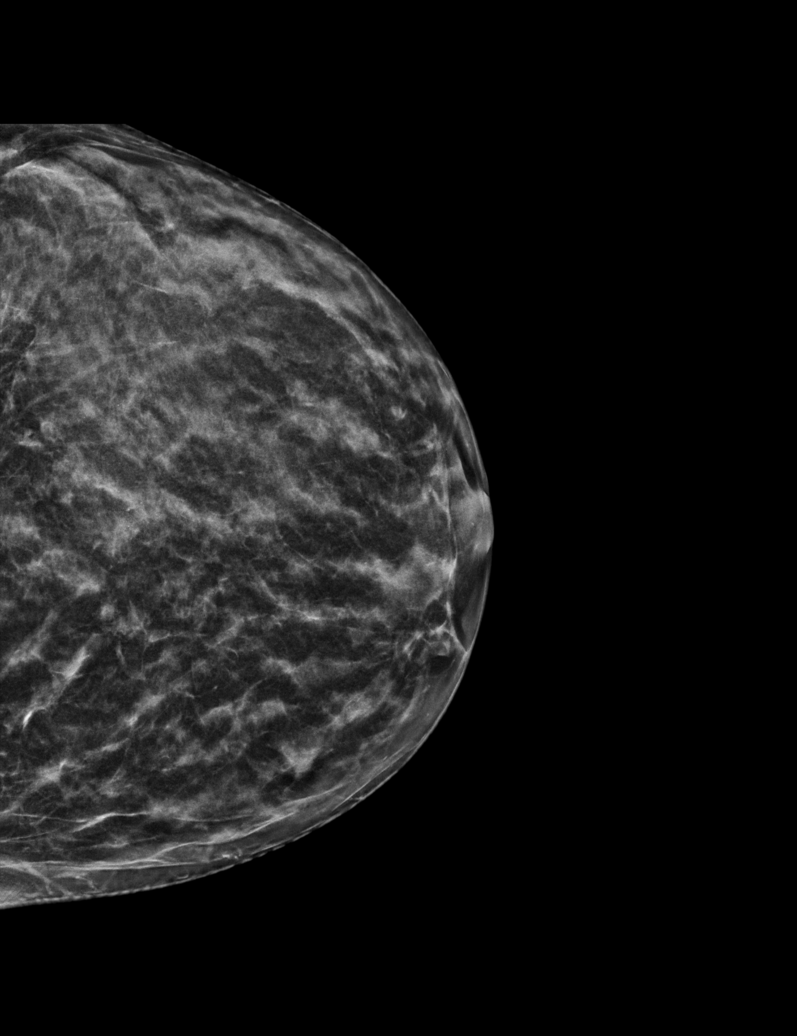

[6 of 36 positions shown; findings below may reference images not displayed]

ACR Breast Density Category d: The breast tissue is extremely dense,
which lowers the sensitivity of mammography.
FINDINGS: In the upper inner quadrant of the right breast there is a possible
obscured mass. However, on the spot compression tomosynthesis
images, no mass persists on the CC view and the same area on the MLO
spot images appears to represent normal fibroglandular tissue.
Ultrasound will be performed for further evaluation. No suspicious
mammographic findings are identified in the lateral aspect of the
right breast.

Mammographic images were processed with CAD.

On physical exam, nodular breast tissue is palpated in the lateral
to slightly lower outer quadrant of the right breast.

Targeted ultrasound is performed, showing normal fibroglandular
tissue in the lateral right breast. No suspicious masses or areas of
shadowing are identified. No suspicious findings are seen in the
superior to upper inner quadrant of the right breast at the site of
the asymmetry seen on the mammogram.
IMPRESSION: 1. There are no suspicious mammographic or targeted sonographic
abnormalities in the lateral right breast.

2.  No mammographic evidence of malignancy in the bilateral breasts.

RECOMMENDATION:
1. Clinical follow-up recommended for the palpable area of concern
in the lateral right breast. Any further workup should be based on
clinical grounds.

2. Screening mammogram at age 41 unless there are persistent or
intervening clinical concerns. (Code:7O-5-TUV)

I have discussed the findings and recommendations with the patient.
If applicable, a reminder letter will be sent to the patient
regarding the next appointment.

BI-RADS CATEGORY  1: Negative.

## 2022-04-07 ENCOUNTER — Other Ambulatory Visit: Payer: Self-pay | Admitting: Obstetrics and Gynecology

## 2022-04-07 DIAGNOSIS — Z1231 Encounter for screening mammogram for malignant neoplasm of breast: Secondary | ICD-10-CM

## 2022-05-22 ENCOUNTER — Ambulatory Visit
Admission: RE | Admit: 2022-05-22 | Discharge: 2022-05-22 | Disposition: A | Discharge status: Home / Self Care | Payer: No Typology Code available for payment source | Source: Ambulatory Visit | Attending: Obstetrics and Gynecology | Admitting: Obstetrics and Gynecology

## 2022-05-22 DIAGNOSIS — Z1231 Encounter for screening mammogram for malignant neoplasm of breast: Secondary | ICD-10-CM

## 2023-04-14 ENCOUNTER — Other Ambulatory Visit: Payer: Self-pay | Admitting: Family Medicine

## 2023-04-14 DIAGNOSIS — Z1231 Encounter for screening mammogram for malignant neoplasm of breast: Secondary | ICD-10-CM

## 2023-06-10 ENCOUNTER — Ambulatory Visit
Admission: RE | Admit: 2023-06-10 | Discharge: 2023-06-10 | Disposition: A | Discharge status: Home / Self Care | Payer: No Typology Code available for payment source | Source: Ambulatory Visit | Attending: Family Medicine | Admitting: Family Medicine

## 2023-06-10 DIAGNOSIS — Z1231 Encounter for screening mammogram for malignant neoplasm of breast: Secondary | ICD-10-CM

## 2024-05-03 ENCOUNTER — Other Ambulatory Visit: Payer: Self-pay | Admitting: Nurse Practitioner

## 2024-05-03 DIAGNOSIS — Z1231 Encounter for screening mammogram for malignant neoplasm of breast: Secondary | ICD-10-CM

## 2024-06-15 ENCOUNTER — Ambulatory Visit

## 2024-06-15 ENCOUNTER — Ambulatory Visit
Admission: RE | Admit: 2024-06-15 | Discharge: 2024-06-15 | Disposition: A | Discharge status: Home / Self Care | Source: Ambulatory Visit | Attending: Nurse Practitioner | Admitting: Nurse Practitioner

## 2024-06-15 DIAGNOSIS — Z1231 Encounter for screening mammogram for malignant neoplasm of breast: Secondary | ICD-10-CM
